# Patient Record
Sex: Female | Born: 1970 | Race: Black or African American | Hispanic: No | Marital: Single | Smoking: Never smoker
Health system: Southern US, Community
[De-identification: ages and names within clinical notes are randomized; demographics above are authoritative.]

## PROBLEM LIST (undated history)

## (undated) DIAGNOSIS — D649 Anemia, unspecified: Secondary | ICD-10-CM

## (undated) DIAGNOSIS — G629 Polyneuropathy, unspecified: Secondary | ICD-10-CM

## (undated) DIAGNOSIS — I10 Essential (primary) hypertension: Secondary | ICD-10-CM

## (undated) DIAGNOSIS — Z5189 Encounter for other specified aftercare: Secondary | ICD-10-CM

## (undated) DIAGNOSIS — G4730 Sleep apnea, unspecified: Secondary | ICD-10-CM

## (undated) DIAGNOSIS — T7840XA Allergy, unspecified, initial encounter: Secondary | ICD-10-CM

## (undated) HISTORY — DX: Polyneuropathy, unspecified: G62.9

## (undated) HISTORY — DX: Sleep apnea, unspecified: G47.30

## (undated) HISTORY — DX: Allergy, unspecified, initial encounter: T78.40XA

## (undated) HISTORY — DX: Anemia, unspecified: D64.9

## (undated) HISTORY — DX: Essential (primary) hypertension: I10

## (undated) HISTORY — DX: Encounter for other specified aftercare: Z51.89

## (undated) HISTORY — PX: ABDOMINAL HYSTERECTOMY: SHX81

---

## 2005-06-01 ENCOUNTER — Emergency Department: Payer: Self-pay | Admitting: Emergency Medicine

## 2005-06-02 ENCOUNTER — Inpatient Hospital Stay: Payer: Self-pay | Admitting: Internal Medicine

## 2005-06-12 ENCOUNTER — Ambulatory Visit: Payer: Self-pay | Admitting: Internal Medicine

## 2006-07-09 ENCOUNTER — Emergency Department: Payer: Self-pay | Admitting: Unknown Physician Specialty

## 2006-07-23 ENCOUNTER — Encounter: Payer: Self-pay | Admitting: Vascular Surgery

## 2006-08-14 ENCOUNTER — Encounter: Payer: Self-pay | Admitting: Vascular Surgery

## 2007-07-29 ENCOUNTER — Encounter: Payer: Self-pay | Admitting: Family Medicine

## 2008-08-30 ENCOUNTER — Ambulatory Visit: Payer: Self-pay

## 2009-04-22 ENCOUNTER — Emergency Department: Payer: Self-pay | Admitting: Emergency Medicine

## 2009-07-08 ENCOUNTER — Encounter: Payer: Self-pay | Admitting: Family Medicine

## 2009-07-15 ENCOUNTER — Encounter: Payer: Self-pay | Admitting: Family Medicine

## 2009-08-14 ENCOUNTER — Encounter: Payer: Self-pay | Admitting: Family Medicine

## 2010-02-11 ENCOUNTER — Observation Stay: Payer: Self-pay | Admitting: Internal Medicine

## 2010-07-03 ENCOUNTER — Ambulatory Visit: Payer: Self-pay | Admitting: Family Medicine

## 2010-10-22 ENCOUNTER — Ambulatory Visit: Payer: Self-pay | Admitting: Family Medicine

## 2011-04-26 ENCOUNTER — Emergency Department: Payer: Self-pay | Admitting: Internal Medicine

## 2011-07-27 IMAGING — CT CT CHEST W/ CM
1 series · 16 of 33 positions shown, 20 images · IV contrast (APPLIED)
Comparison: none

REASON FOR EXAM: cp sob high d dimer
COMMENTS:   May transport without cardiac monitor

PROCEDURE:     CT  - CT CHEST WITH CONTRAST  - February 11, 2010  [DATE]
RESULT:     Chest CT dated 02/11/2010.
TECHNIQUE: Helical 3 mm sections were obtained from the thoracic inlet
through lung bases status post intravenous administration of 100 mL of
Qsovue-AN5.

[Series 8: soft tissue · axial · 0.82mm/px · z∈[-698,-418]mm · 16 of 101 slices shown, 20 images]
[im 4/101  mediastinal]
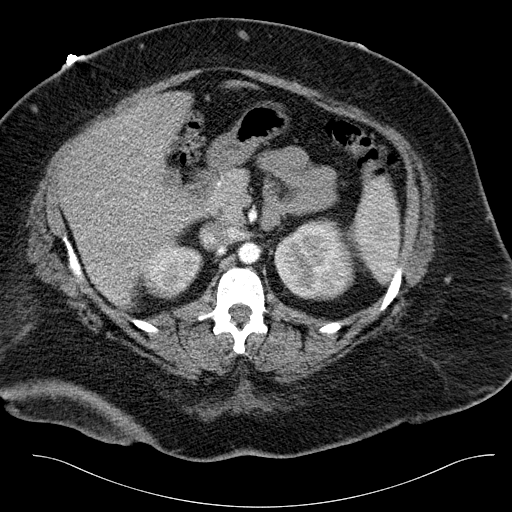
[im 4/101  lung]
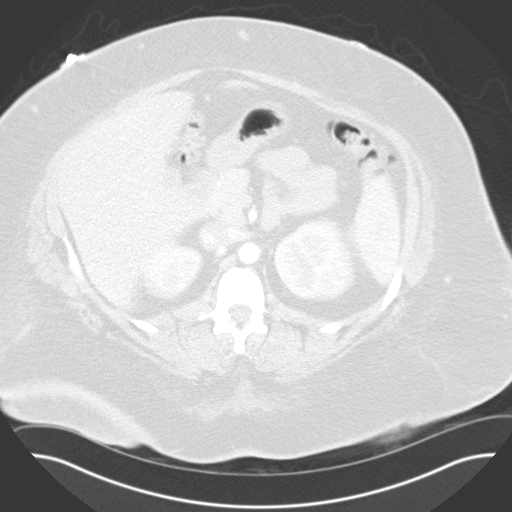
[im 12/101  lung]
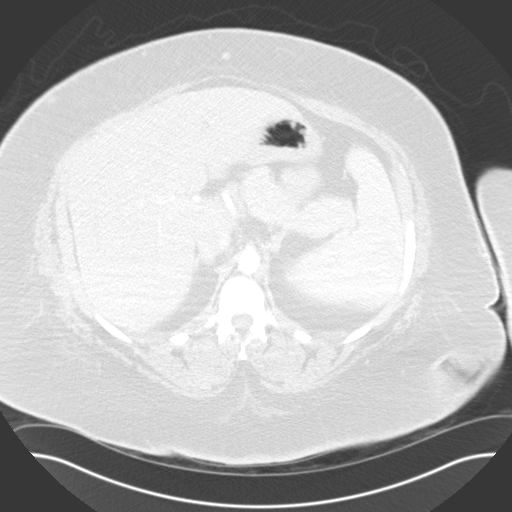
[im 19/101  lung]
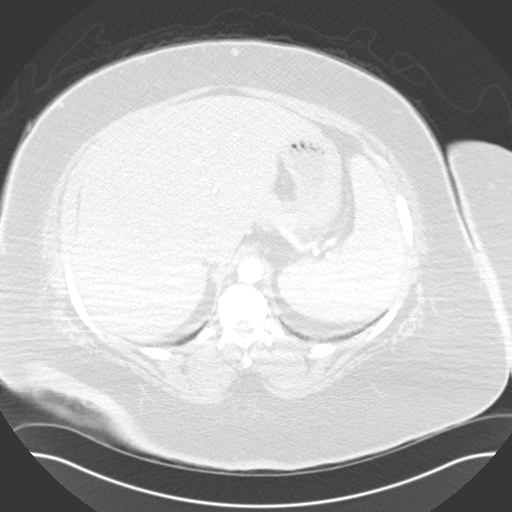
[im 23/101  lung]
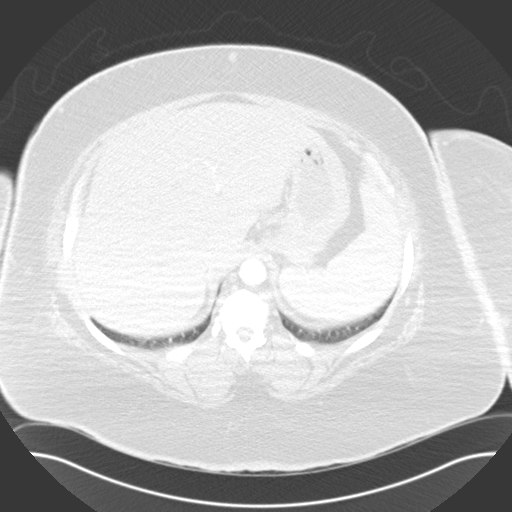
[im 30/101  mediastinal]
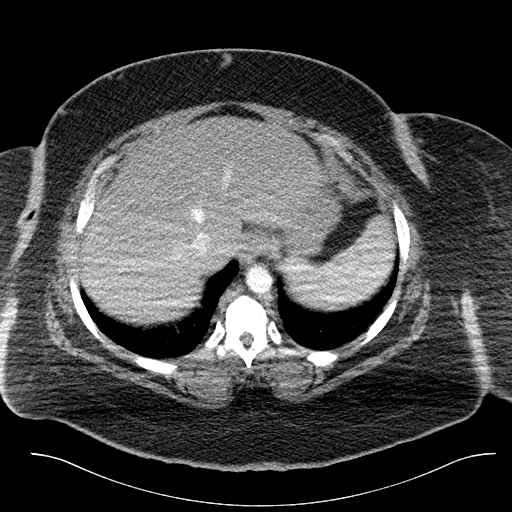
[im 30/101  lung]
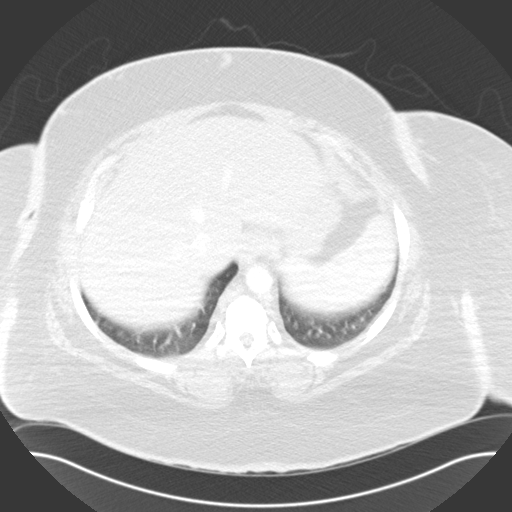
[im 38/101  lung]
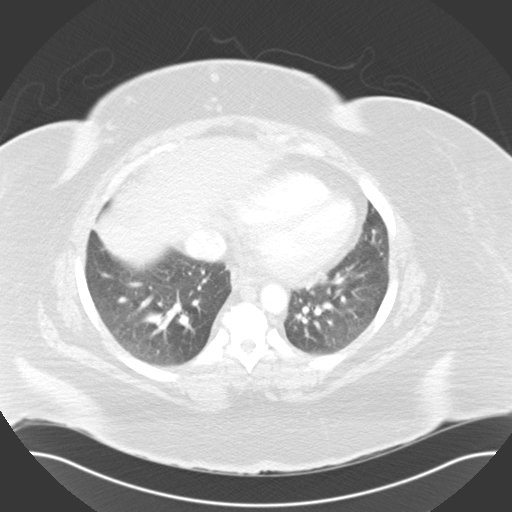
[im 45/101  lung]
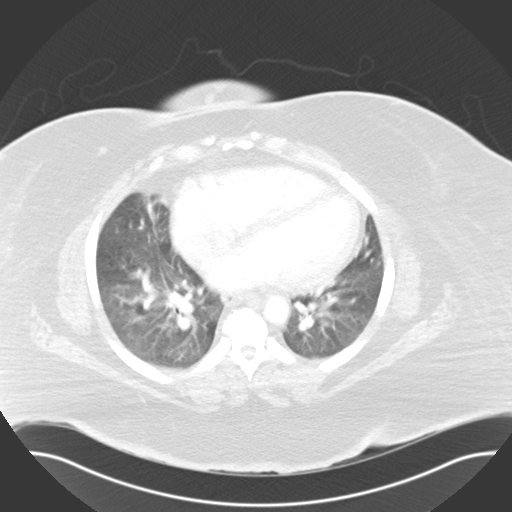
[im 49/101  lung]
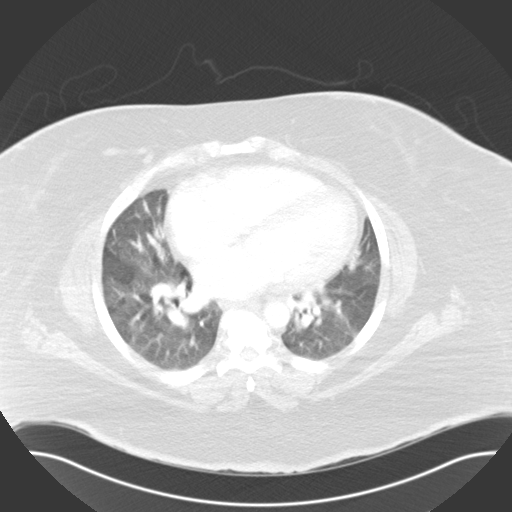
[im 54/101  mediastinal]
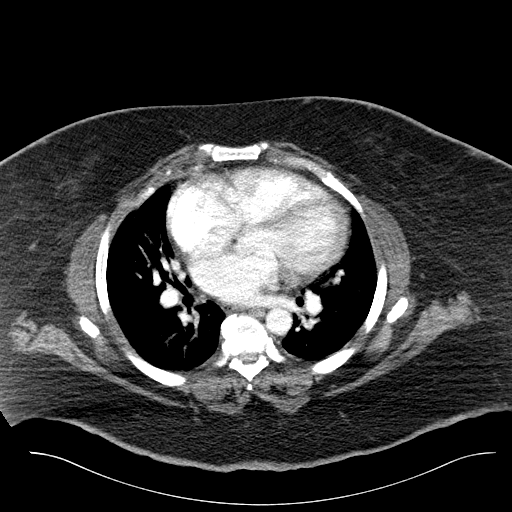
[im 54/101  lung]
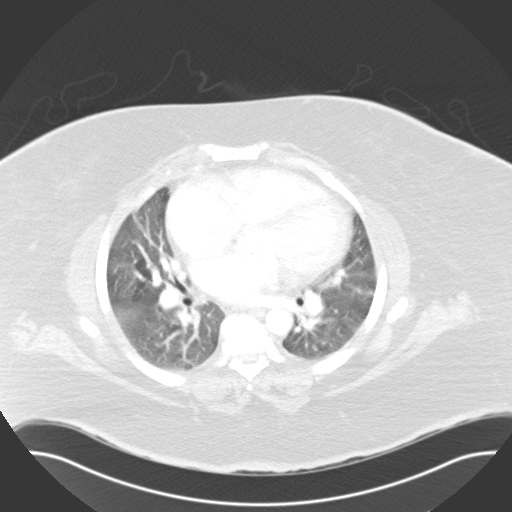
[im 60/101  lung]
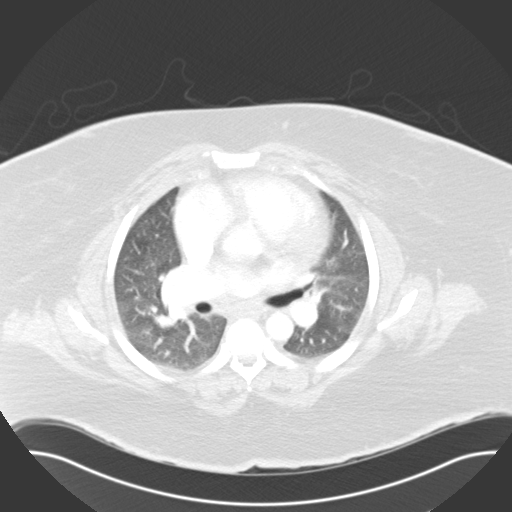
[im 63/101  lung]
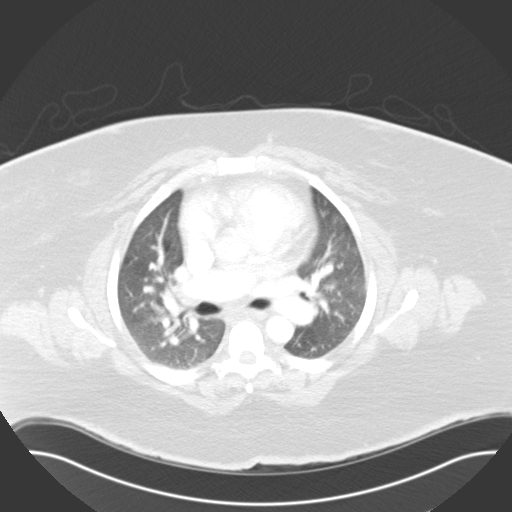
[im 71/101  lung]
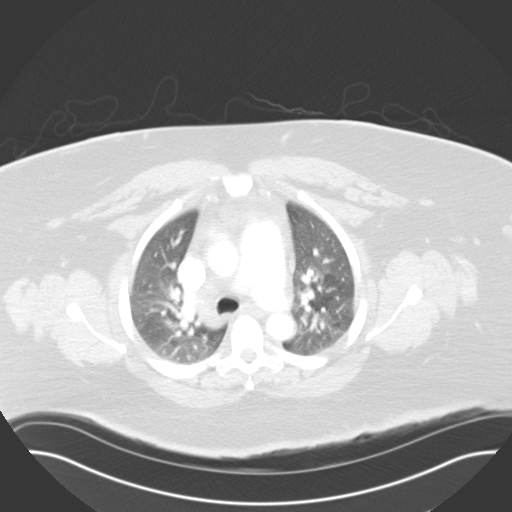
[im 78/101  mediastinal]
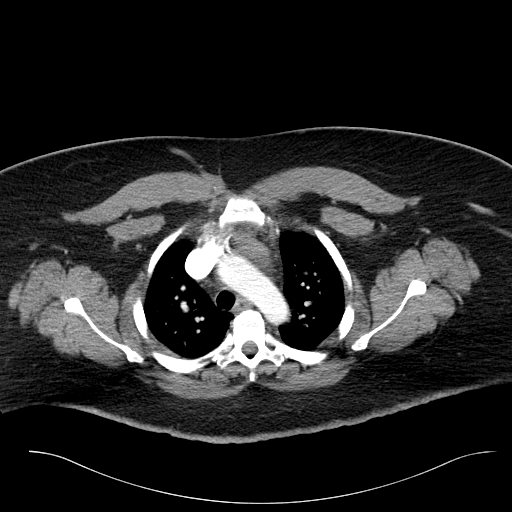
[im 78/101  lung]
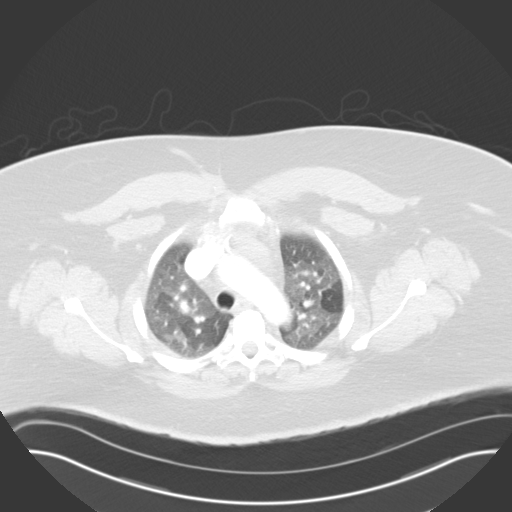
[im 82/101  lung]
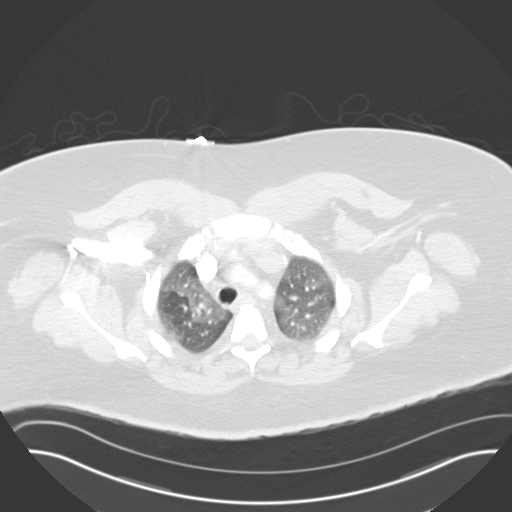
[im 89/101  lung]
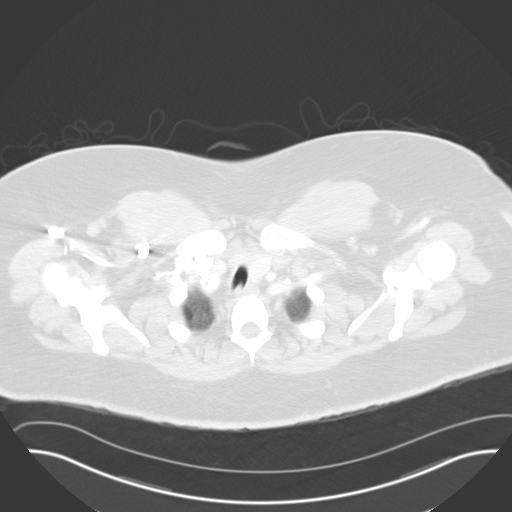
[im 97/101  lung]
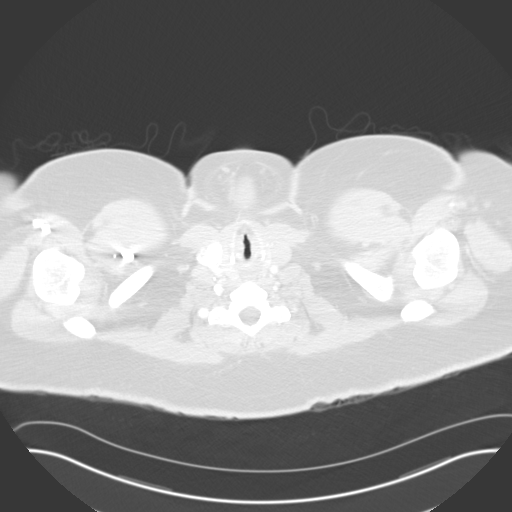

[16 of 33 positions shown; findings below may reference images not displayed]

FINDINGS: Evaluation of the mediastinum and hilar regions and structures
demonstrates no evidence of mediastinal nor hilar adenopathy nor masses.
There is no evidence of filling defects within the main, lobar, or segmental
pulmonary arteries. Evaluation of the lung parenchyma demonstrates diffuse
hypoventilation.

Visualized upper abdominal viscera demonstrate no gross abnormalities.
IMPRESSION: No CT evidence of pulmonary arterial embolic disease.
2. Hypoventilation within the lungs.

## 2014-04-10 DIAGNOSIS — K219 Gastro-esophageal reflux disease without esophagitis: Secondary | ICD-10-CM | POA: Insufficient documentation

## 2014-04-10 DIAGNOSIS — Z86718 Personal history of other venous thrombosis and embolism: Secondary | ICD-10-CM | POA: Insufficient documentation

## 2016-02-21 ENCOUNTER — Other Ambulatory Visit: Payer: Self-pay | Admitting: Family Medicine

## 2016-02-21 DIAGNOSIS — N644 Mastodynia: Secondary | ICD-10-CM

## 2016-03-16 ENCOUNTER — Other Ambulatory Visit: Payer: Self-pay | Admitting: Family Medicine

## 2016-03-16 DIAGNOSIS — N644 Mastodynia: Secondary | ICD-10-CM

## 2016-04-06 ENCOUNTER — Ambulatory Visit: Payer: Self-pay

## 2016-05-19 ENCOUNTER — Ambulatory Visit
Admission: RE | Admit: 2016-05-19 | Discharge: 2016-05-19 | Disposition: A | Discharge status: Home / Self Care | Payer: Medicaid Other | Source: Ambulatory Visit | Attending: Family Medicine | Admitting: Family Medicine

## 2016-05-19 DIAGNOSIS — Z1231 Encounter for screening mammogram for malignant neoplasm of breast: Secondary | ICD-10-CM | POA: Insufficient documentation

## 2016-05-19 DIAGNOSIS — N644 Mastodynia: Secondary | ICD-10-CM

## 2016-08-13 ENCOUNTER — Ambulatory Visit: Payer: Medicaid Other | Attending: Neurology

## 2016-08-25 ENCOUNTER — Ambulatory Visit: Payer: Medicaid Other | Attending: Neurology

## 2016-08-25 DIAGNOSIS — G4733 Obstructive sleep apnea (adult) (pediatric): Secondary | ICD-10-CM | POA: Insufficient documentation

## 2016-08-25 DIAGNOSIS — Z79899 Other long term (current) drug therapy: Secondary | ICD-10-CM | POA: Insufficient documentation

## 2016-08-25 DIAGNOSIS — I10 Essential (primary) hypertension: Secondary | ICD-10-CM | POA: Insufficient documentation

## 2016-08-25 DIAGNOSIS — I890 Lymphedema, not elsewhere classified: Secondary | ICD-10-CM | POA: Diagnosis not present

## 2016-08-25 DIAGNOSIS — Z7982 Long term (current) use of aspirin: Secondary | ICD-10-CM | POA: Diagnosis not present

## 2016-08-25 DIAGNOSIS — Z9981 Dependence on supplemental oxygen: Secondary | ICD-10-CM | POA: Insufficient documentation

## 2016-08-25 DIAGNOSIS — E119 Type 2 diabetes mellitus without complications: Secondary | ICD-10-CM | POA: Diagnosis not present

## 2016-08-25 DIAGNOSIS — Z6841 Body Mass Index (BMI) 40.0 and over, adult: Secondary | ICD-10-CM | POA: Diagnosis not present

## 2016-08-25 DIAGNOSIS — G4730 Sleep apnea, unspecified: Secondary | ICD-10-CM | POA: Diagnosis present

## 2016-11-26 ENCOUNTER — Ambulatory Visit: Payer: Medicaid Other | Attending: Neurology

## 2016-11-26 DIAGNOSIS — G4733 Obstructive sleep apnea (adult) (pediatric): Secondary | ICD-10-CM | POA: Diagnosis not present

## 2023-03-17 ENCOUNTER — Ambulatory Visit: Payer: Medicaid Other | Admitting: Family Medicine

## 2023-03-17 ENCOUNTER — Encounter: Payer: Self-pay | Admitting: Family Medicine

## 2023-03-17 VITALS — BP 141/79 | HR 70 | Ht 59.0 in

## 2023-03-17 DIAGNOSIS — M792 Neuralgia and neuritis, unspecified: Secondary | ICD-10-CM | POA: Diagnosis not present

## 2023-03-17 DIAGNOSIS — G4730 Sleep apnea, unspecified: Secondary | ICD-10-CM

## 2023-03-17 DIAGNOSIS — F339 Major depressive disorder, recurrent, unspecified: Secondary | ICD-10-CM

## 2023-03-17 DIAGNOSIS — Z23 Encounter for immunization: Secondary | ICD-10-CM

## 2023-03-17 DIAGNOSIS — R262 Difficulty in walking, not elsewhere classified: Secondary | ICD-10-CM | POA: Insufficient documentation

## 2023-03-17 DIAGNOSIS — Z1211 Encounter for screening for malignant neoplasm of colon: Secondary | ICD-10-CM

## 2023-03-17 DIAGNOSIS — Z1231 Encounter for screening mammogram for malignant neoplasm of breast: Secondary | ICD-10-CM

## 2023-03-17 DIAGNOSIS — Z6841 Body Mass Index (BMI) 40.0 and over, adult: Secondary | ICD-10-CM | POA: Diagnosis not present

## 2023-03-17 DIAGNOSIS — I890 Lymphedema, not elsewhere classified: Secondary | ICD-10-CM | POA: Diagnosis not present

## 2023-03-17 DIAGNOSIS — R5382 Chronic fatigue, unspecified: Secondary | ICD-10-CM

## 2023-03-17 DIAGNOSIS — I10 Essential (primary) hypertension: Secondary | ICD-10-CM

## 2023-03-17 DIAGNOSIS — E559 Vitamin D deficiency, unspecified: Secondary | ICD-10-CM

## 2023-03-17 DIAGNOSIS — R531 Weakness: Secondary | ICD-10-CM

## 2023-03-17 DIAGNOSIS — L97929 Non-pressure chronic ulcer of unspecified part of left lower leg with unspecified severity: Secondary | ICD-10-CM

## 2023-03-17 DIAGNOSIS — R6889 Other general symptoms and signs: Secondary | ICD-10-CM

## 2023-03-17 DIAGNOSIS — R32 Unspecified urinary incontinence: Secondary | ICD-10-CM

## 2023-03-17 DIAGNOSIS — Z0000 Encounter for general adult medical examination without abnormal findings: Secondary | ICD-10-CM | POA: Diagnosis not present

## 2023-03-17 NOTE — Progress Notes (Addendum)
ergo   New patient visit   Patient: Gabriela Mcgee   DOB: January 02, 1971   52 y.o. Female  MRN: [id number] Visit Date: 03/17/2023  Today's healthcare provider: [name], DO   Chief Complaint  Patient presents with   New Patient (Initial Visit)    Patient reports a sore on the back of her leg and possibly on the right leg. Patient reports she has had the sore for about 2 months now, associated with discharge. Reports no change in size and it will try healing itself and opens back up. Patient reports she is also having concerns wit sleeping laying down and states she currently sleeps in a recliner. Reports she has been doing so for years now. Also can not tell when she has to pee and when she does she is not able to hold it she has to go.    Subjective    Gabriela Mcgee is a 52 y.o. female who presents today as a new patient to establish care.  HPI HPI     New Patient (Initial Visit)    Additional comments: Patient reports a sore on the back of her leg and possibly on the right leg. Patient reports she has had the sore for about 2 months now, associated with discharge. Reports no change in size and it will try healing itself and opens back up. Patient reports she is also having concerns wit sleeping laying down and states she currently sleeps in a recliner. Reports she has been doing so for years now. Also can not tell when she has to pee and when she does she is not able to hold it she has to go.         Comments   -Patients sister is having hard time keeping toe nails cut due to skin starting to grow right on them.  -NO [facility] record       Last edited by Acey Lav, CMA on 03/17/2023  3:36 PM.      Sore on back of left leg:  - has had for about two months. Gets a little better, then gets worse again. Worries about covering it because she is worried about getting an infection.  - her sister works at the hospital and has been checking it for her  - some days doesn't drain  at all; other days drains "like running water"  - hasn't been out of the house much at all in the last three months  - Thinks right leg has a sore as well but can't see it  - Fluid leaks through the skin.  Can't stand up or sit down for long.  Bilateral lymph edema:   - Previously had someone coming to the house to fit her for wraps. Has the wraps but no one would come due to pandemic starting.  - Has had several surgeries to get rid of the lumps.  - Has severe neuropathy; extreme numbness and burning  - Has trouble finding shoes. Has trouble with grip on floor (slides). Can't always feel the floor under her feet. Never walks without shoes  - Used to being able to get up, go into kitchen and fix lunch and dinner; is not able to do so anymore.  Has sleep apnea. Has had machine a very long time but she feels it still works very well. Sleeps in a recliner but it doesn't work well.  Pees regularly (no frequency) but has incontinence  Used to take medicine for anxiety.  Having a lot more anxiety now.  Very irritable now.   Was previously taking: Oxybutynin 5mg  Citalopram 20mg  Losartan 25mg  Naproxen 500mg  - > not prescribing Gabapentin 600mg  bid -> lower dose tid Albuterol inhaler Aspirin 81mg  - hold off  - Before her first surgery, they "thought she might have blood clots and recommended aspirin." She hasn't been taking for several years now.    Past Medical History:  Diagnosis Date   Allergy    Anemia    Blood transfusion without reported diagnosis    Hypertension    Neuropathy    Sleep apnea    History reviewed. No pertinent surgical history. Family Status  Relation Name Status   Mother  Deceased   Neg Hx  (Not Specified)  No partnership data on file   Family History  Problem Relation Age of Onset   Heart failure Mother 15 - 53   Breast cancer Neg Hx    Social History   Socioeconomic History   Marital status: Single    Spouse name: Not on file   Number of  children: Not on file   Years of education: Not on file   Highest education level: Not on file  Occupational History   Not on file  Tobacco Use   Smoking status: Never   Smokeless tobacco: Never  Vaping Use   Vaping status: Never Used  Substance and Sexual Activity   Alcohol use: Never   Drug use: Never   Sexual activity: Not on file  Other Topics Concern   Not on file  Social History Narrative   Not on file   Social Determinants of Health   Financial Resource Strain: Not on file  Food Insecurity: No Food Insecurity (03/25/2023)   Hunger Vital Sign    Worried About Running Out of Food in the Last Year: Never true    Ran Out of Food in the Last Year: Never true  Transportation Needs: No Transportation Needs (03/25/2023)   PRAPARE - Administrator, Civil Service (Medical): No    Lack of Transportation (Non-Medical): No  Physical Activity: Not on file  Stress: Not on file  Social Connections: Not on file   No outpatient medications prior to visit.   No facility-administered medications prior to visit.   Allergies  Allergen Reactions   Lisinopril Swelling   Silicone Dermatitis    Paper and silk tape OK per patient 04/10/2014   Morphine     Very hard to wake up afterwards   Fluconazole Rash   Penicillin G Rash   Tape Dermatitis and Rash    Oozing    Immunization History  Administered Date(s) Administered   Influenza,inj,Quad PF,6+ Mos 09/10/2017    Health Maintenance  Topic Date Due   DTaP/Tdap/Td (1 - Tdap) Never done   PAP SMEAR-Modifier  Never done   Colonoscopy  Never done   MAMMOGRAM  08/29/2019   Zoster Vaccines- Shingrix (1 of 2) Never done   COVID-19 Vaccine (1 - 2023-24 season) Never done   INFLUENZA VACCINE  04/15/2023   Hepatitis C Screening  Completed   HIV Screening  Completed   HPV VACCINES  Aged Out    Patient Care Team: Ron Junco, Monico Blitz, DO as PCP - General (Family Medicine) [name], RN as Case Manager  Review of  Systems  Constitutional:  Positive for fatigue. Negative for chills and fever.  HENT:  Negative for congestion, rhinorrhea and sneezing.   Eyes:  Negative for itching and  visual disturbance.  Respiratory:  Positive for apnea and shortness of breath. Negative for cough, chest tightness, wheezing and stridor.   Cardiovascular:  Negative for chest pain and palpitations.  Gastrointestinal:  Negative for abdominal pain, nausea and vomiting.  Genitourinary:  Positive for enuresis. Negative for frequency.  Musculoskeletal:  Positive for gait problem.  Skin:  Positive for color change and wound. Negative for rash.  Neurological:  Positive for weakness and numbness. Negative for dizziness and light-headedness.  Psychiatric/Behavioral:  The patient is nervous/anxious.        Objective    BP (!) 141/79   Pulse 70   Ht 4\' 11"  (1.499 m)   SpO2 100%    Physical Exam Vitals and nursing note reviewed.  Constitutional:      General: She is not in acute distress.    Appearance: Normal appearance. She is well-developed. She is morbidly obese. She is not diaphoretic.  HENT:     Head: Normocephalic and atraumatic.     Right Ear: Tympanic membrane, ear canal and external ear normal.     Left Ear: Tympanic membrane, ear canal and external ear normal.     Nose: Nose normal.     Mouth/Throat:     Mouth: Mucous membranes are moist.     Pharynx: Oropharynx is clear. No oropharyngeal exudate.  Eyes:     General: No scleral icterus.    Extraocular Movements: Extraocular movements intact.     Conjunctiva/sclera: Conjunctivae normal.     Pupils: Pupils are equal, round, and reactive to light.  Neck:     Thyroid: No thyromegaly.  Cardiovascular:     Rate and Rhythm: Normal rate and regular rhythm.     Pulses: Normal pulses.     Heart sounds: Normal heart sounds. No murmur heard. Pulmonary:     Effort: Pulmonary effort is normal. No respiratory distress.     Breath sounds: Normal breath sounds. No  wheezing or rales.  Abdominal:     General: Bowel sounds are normal. There is no distension.     Palpations: Abdomen is soft. There is no mass.     Tenderness: There is no abdominal tenderness. There is no guarding.  Musculoskeletal:        General: Deformity present.     Cervical back: Neck supple.     Right hip: Decreased range of motion.     Left hip: Decreased range of motion.     Right upper leg: Swelling, edema and deformity present.     Left upper leg: Swelling, edema and deformity present.     Right knee: Swelling and deformity present. Decreased range of motion.     Left knee: Swelling and deformity present. Decreased range of motion.     Right lower leg: Swelling and deformity present. Edema present.     Left lower leg: Swelling and deformity present. Edema present.     Right ankle: Swelling and deformity present. Decreased range of motion.     Left ankle: Swelling and deformity present. Decreased range of motion.     Right foot: Decreased range of motion. Swelling and deformity present.     Left foot: Decreased range of motion. Swelling and deformity present.     Comments: Deformity s/t extreme chronic swelling and previous excisions.   Lymphadenopathy:     Cervical: No cervical adenopathy.  Skin:    General: Skin is warm and dry.     Findings: Wound present. No rash.     Comments: Wound  present to posterior aspect of left leg as documented in pictures under media tab. Significant serous drainage present. No evidence of wound infection.  Neurological:     Mental Status: She is alert and oriented to person, place, and time. Mental status is at baseline.     Gait: Gait abnormal.  Psychiatric:        Mood and Affect: Mood normal.        Behavior: Behavior normal.        Thought Content: Thought content normal.     Depression Screen    03/17/2023    3:36 PM  PHQ 2/9 Scores  PHQ - 2 Score 3  PHQ- 9 Score 12   Results for orders placed or performed in visit on 03/17/23   Comprehensive metabolic panel  Result Value Ref Range   Glucose 91 70 - 99 mg/dL   BUN 11 6 - 24 mg/dL   Creatinine, Ser 1.09 0.57 - 1.00 mg/dL   eGFR 323 >55 DD/UKG/2.54   BUN/Creatinine Ratio 19 9 - 23   Sodium 141 134 - 144 mmol/L   Potassium 4.3 3.5 - 5.2 mmol/L   Chloride 103 96 - 106 mmol/L   CO2 25 20 - 29 mmol/L   Calcium 9.4 8.7 - 10.2 mg/dL   Total Protein 7.8 6.0 - 8.5 g/dL   Albumin 4.3 3.8 - 4.9 g/dL   Globulin, Total 3.5 1.5 - 4.5 g/dL   Bilirubin Total 1.1 0.0 - 1.2 mg/dL   Alkaline Phosphatase 77 44 - 121 IU/L   AST 8 0 - 40 IU/L   ALT 6 0 - 32 IU/L  Hemoglobin A1c  Result Value Ref Range   Hgb A1c MFr Bld 6.3 (H) 4.8 - 5.6 %   Est. average glucose Bld gHb Est-mCnc 134 mg/dL  Lipid panel  Result Value Ref Range   Cholesterol, Total 152 100 - 199 mg/dL   Triglycerides 73 0 - 149 mg/dL   HDL 54 >27 mg/dL   VLDL Cholesterol Cal 14 5 - 40 mg/dL   LDL Chol Calc (NIH) 84 0 - 99 mg/dL   Chol/HDL Ratio 2.8 0.0 - 4.4 ratio  VITAMIN D 25 Hydroxy (Vit-D Deficiency, Fractures)  Result Value Ref Range   Vit D, 25-Hydroxy 6.5 (L) 30.0 - 100.0 ng/mL  Vitamin B12  Result Value Ref Range   Vitamin B-12 520 232 - 1,245 pg/mL  TSH Rfx on Abnormal to Free T4  Result Value Ref Range   TSH 3.110 0.450 - 4.500 uIU/mL  CBC With Differential  Result Value Ref Range   WBC 4.1 3.4 - 10.8 x10E3/uL   RBC 5.28 3.77 - 5.28 x10E6/uL   Hemoglobin 12.5 11.1 - 15.9 g/dL   Hematocrit 06.2 37.6 - 46.6 %   MCV 76 (L) 79 - 97 fL   MCH 23.7 (L) 26.6 - 33.0 pg   MCHC 31.2 (L) 31.5 - 35.7 g/dL   RDW 28.3 (H) 15.1 - 76.1 %   Neutrophils 64 Not Estab. %   Lymphs 26 Not Estab. %   Monocytes 7 Not Estab. %   Eos 2 Not Estab. %   Basos 1 Not Estab. %   Neutrophils Absolute 2.6 1.4 - 7.0 x10E3/uL   Lymphocytes Absolute 1.1 0.7 - 3.1 x10E3/uL   Monocytes Absolute 0.3 0.1 - 0.9 x10E3/uL   EOS (ABSOLUTE) 0.1 0.0 - 0.4 x10E3/uL   Basophils Absolute 0.0 0.0 - 0.2 x10E3/uL   Immature  Granulocytes 0 Not Estab. %  Immature Grans (Abs) 0.0 0.0 - 0.1 x10E3/uL  HCV Ab w Reflex to Quant PCR  Result Value Ref Range   HCV Ab Non Reactive Non Reactive  HIV Antibody (routine testing w rflx)  Result Value Ref Range   HIV Screen 4th Generation wRfx Non Reactive Non Reactive  Interpretation:  Result Value Ref Range   HCV Interp 1: Comment     Assessment & Plan     Annual physical exam Assessment & Plan: Physical exam with abnormalities as noted.  Patient has significant difficulty leaving her home due to her extreme lymphedema, which causes her significant neuropathic pain and makes ambulation extremely difficult and tiring, with associated dyspnea on exertion.  Will refer patient for home health services.  -Will also order routine lab work as noted below to have a baseline.  Will screen for hepatitis C and HIV as well as refer patient for mammogram.  Patient does not want colonoscopy but is amenable to screening with a Cologuard test.  Will order this as well.  Orders: -     3D Screening Mammogram, Left and Right; Future -     Comprehensive metabolic panel -     Lipid panel -     TSH Rfx on Abnormal to Free T4 -     CBC With Differential -     HCV Ab w Reflex to Quant PCR -     HIV Antibody (routine testing w rflx) -     Cologuard -     Interpretation:  Vitamin D deficiency -     VITAMIN D 25 Hydroxy (Vit-D Deficiency, Fractures) -     Interpretation: -     Vitamin D (Ergocalciferol); Take 1 capsule (50,000 Units total) by mouth every 7 (seven) days.  Dispense: 12 capsule; Refill: 1  Morbid obesity with BMI of 60.0-69.9, adult (HCC) -     Hemoglobin A1c -     Ambulatory referral to Home Health -     Interpretation: -     AMB Referral to Managed Medicaid Care Management  Increased weakness when ambulating Assessment & Plan: Patient experiences significant weakness with ambulation and is unable to ambulate very far prior to needing to rest.  Orders: -      Ambulatory referral to Home Health -     For home use only DME Hospital bed -     AMB Referral to Managed Medicaid Care Management  Difficulty maintaining body in lying position Assessment & Plan: Patient has significant difficulty/inability to lay down beyond a slightly reclined sitting position.  She states that if she does lay down, she is unable to position herself or turn from side-to-side.  She also experiences severe difficulty breathing, likely secondary to her body habitus.  Will order a hospital bed for home use.  Orders: -     For home use only DME Hospital bed -     AMB Referral to Managed Medicaid Care Management  Sleep apnea treated with nocturnal bilevel positive airway pressure (BPAP) Assessment & Plan: Patient currently utilizing BiPAP every night, throughout the night.  Orders: -     For home use only DME Hospital bed -     AMB Referral to Managed Medicaid Care Management  Lymphedema of both lower extremities Assessment & Plan: Patient has severe lymphedema of her bilateral lower extremities with history of multiple prior surgical excisions.  Will refer patient for home health due to her extreme difficulty leaving her home.  However, we will also  have to refer her to occupational therapy due to the need for treatment by a lymphedema specialist.  Will also refer to manage Medicaid care management to facilitate coordination of care and ensure the patient is having the follow-up she needs, especially given that she was lost to follow-up once the pandemic started, which caused her lymphedema to become substantially worse than it could have been has she been consistently treated.  Orders: -     Ambulatory referral to Home Health -     Ambulatory referral to Occupational Therapy -     AMB Referral to Managed Medicaid Care Management  Depression, recurrent [facility] Assessment & Plan: Restarting patient citalopram as noted below.  Patient recalls doing well on it.  Will have  patient follow-up via a virtual appointment.  Orders: -     Citalopram Hydrobromide; Take 1 tablet (20 mg total) by mouth daily.  Dispense: 30 tablet; Refill: 3 -     AMB Referral to Managed Medicaid Care Management  Primary hypertension Assessment & Plan: Patient's blood pressure is elevated today.  However, she is not at the blood pressure medication she was previously treated with.  Will refill today as noted below.  Referring patient for home health, so we will plan on having them check her vital signs at home.  Orders: -     Losartan Potassium; Take 1 tablet (25 mg total) by mouth daily.  Dispense: 30 tablet; Refill: 0  Peripheral neuropathic pain Assessment & Plan: Refilled patient's gabapentin, starting at a lower dose, as I discussed with her during her visit.  Will follow-up with her at a subsequent virtual appointment to see how effective this is for her.  Orders: -     Vitamin B12 -     Gabapentin; Take 1 capsule (300 mg total) by mouth 3 (three) times daily.  Dispense: 90 capsule; Refill: 0 -     AMB Referral to Managed Medicaid Care Management  Urinary incontinence, unspecified type Assessment & Plan: Patient suffered significant urinary incontinence, exacerbated by not having her prescription for oxybutynin, after being lost to follow-up due to COVID.  Refilling her oxybutynin today.  Orders: -     oxyBUTYnin Chloride ER; Take 1 tablet (5 mg total) by mouth at bedtime.  Dispense: 30 tablet; Refill: 0 -     AMB Referral to Managed Medicaid Care Management  Venous stasis ulcer of left lower extremity (HCC) Assessment & Plan: Patient has a significant venous stasis ulcer on the posterior left extremity (approx. 4 inch x 5.5 inch), as well as a smaller stasis ulcer inferior lateral to that (approx. 3/4 inch diameter).  These do not appear infected at this time.  Will plan to have home health address these in her home with regular wound care. Follow with OT for lymphedema  treatment will also facilitate healing of these ulcers.  Orders: -     AMB Referral to Managed Medicaid Care Management  Other orders -     Albuterol Sulfate HFA; Inhale 1-2 puffs into the lungs every 6 (six) hours as needed for shortness of breath.  Dispense: 1 each; Refill: 2     Return in about 3 weeks (around 04/07/2023) for virtual, Dep.     The entirety of the information documented in the History of Present Illness, Review of Systems and Physical Exam were personally obtained by me. Portions of this information were initially documented by the CMA,Sha'taria Chales Abrahams, and reviewed by me for thoroughness and accuracy.   I  discussed the assessment and treatment plan with the patient  The patient was provided an opportunity to ask questions and all were answered. The patient agreed with the plan and demonstrated an understanding of the instructions.   The patient was advised to call back or seek an in-person evaluation if the symptoms worsen or if the condition fails to improve as anticipated.    [name], DO  [facility] [phone] (phone) [phone] (fax)  [facility]

## 2023-03-18 LAB — LIPID PANEL
Chol/HDL Ratio: 2.8 ratio (ref 0.0–4.4)
Cholesterol, Total: 152 mg/dL (ref 100–199)
HDL: 54 mg/dL (ref >39)
LDL Chol Calc (NIH): 84 mg/dL (ref 0–99)
Triglycerides: 73 mg/dL (ref 0–149)
VLDL Cholesterol Cal: 14 mg/dL (ref 5–40)

## 2023-03-18 LAB — COMPREHENSIVE METABOLIC PANEL
ALT: 6 IU/L (ref 0–32)
AST: 8 IU/L (ref 0–40)
Albumin: 4.3 g/dL (ref 3.8–4.9)
Alkaline Phosphatase: 77 IU/L (ref 44–121)
BUN/Creatinine Ratio: 19 (ref 9–23)
BUN: 11 mg/dL (ref 6–24)
Bilirubin Total: 1.1 mg/dL (ref 0.0–1.2)
CO2: 25 mmol/L (ref 20–29)
Calcium: 9.4 mg/dL (ref 8.7–10.2)
Chloride: 103 mmol/L (ref 96–106)
Creatinine, Ser: 0.59 mg/dL (ref 0.57–1.00)
Globulin, Total: 3.5 g/dL (ref 1.5–4.5)
Glucose: 91 mg/dL (ref 70–99)
Potassium: 4.3 mmol/L (ref 3.5–5.2)
Sodium: 141 mmol/L (ref 134–144)
Total Protein: 7.8 g/dL (ref 6.0–8.5)
eGFR: 108 mL/min/{1.73_m2} (ref >59)

## 2023-03-18 LAB — CBC WITH DIFFERENTIAL
Basophils Absolute: 0 10*3/uL (ref 0.0–0.2)
Basos: 1 %
EOS (ABSOLUTE): 0.1 10*3/uL (ref 0.0–0.4)
Eos: 2 %
Hematocrit: 40.1 % (ref 34.0–46.6)
Hemoglobin: 12.5 g/dL (ref 11.1–15.9)
Immature Grans (Abs): 0 10*3/uL (ref 0.0–0.1)
Immature Granulocytes: 0 %
Lymphocytes Absolute: 1.1 10*3/uL (ref 0.7–3.1)
Lymphs: 26 %
MCH: 23.7 pg — ABNORMAL LOW (ref 26.6–33.0)
MCHC: 31.2 g/dL — ABNORMAL LOW (ref 31.5–35.7)
MCV: 76 fL — ABNORMAL LOW (ref 79–97)
Monocytes Absolute: 0.3 10*3/uL (ref 0.1–0.9)
Monocytes: 7 %
Neutrophils Absolute: 2.6 10*3/uL (ref 1.4–7.0)
Neutrophils: 64 %
RBC: 5.28 x10E6/uL (ref 3.77–5.28)
RDW: 16 % — ABNORMAL HIGH (ref 11.7–15.4)
WBC: 4.1 10*3/uL (ref 3.4–10.8)

## 2023-03-18 LAB — HEMOGLOBIN A1C
Est. average glucose Bld gHb Est-mCnc: 134 mg/dL
Hgb A1c MFr Bld: 6.3 % — ABNORMAL HIGH (ref 4.8–5.6)

## 2023-03-18 LAB — HCV AB W REFLEX TO QUANT PCR: HCV Ab: NONREACTIVE

## 2023-03-18 LAB — VITAMIN B12: Vitamin B-12: 520 pg/mL (ref 232–1245)

## 2023-03-18 LAB — VITAMIN D 25 HYDROXY (VIT D DEFICIENCY, FRACTURES): Vit D, 25-Hydroxy: 6.5 ng/mL — ABNORMAL LOW (ref 30.0–100.0)

## 2023-03-18 LAB — HIV ANTIBODY (ROUTINE TESTING W REFLEX): HIV Screen 4th Generation wRfx: NONREACTIVE

## 2023-03-18 LAB — TSH RFX ON ABNORMAL TO FREE T4: TSH: 3.11 u[IU]/mL (ref 0.450–4.500)

## 2023-03-18 LAB — HCV INTERPRETATION

## 2023-03-18 MED ORDER — VITAMIN D (ERGOCALCIFEROL) 1.25 MG (50000 UNIT) PO CAPS
50000.0000 [IU] | ORAL_CAPSULE | ORAL | 1 refills | Status: DC
Start: 2023-03-18 — End: 2023-12-31

## 2023-03-18 MED ORDER — ALBUTEROL SULFATE HFA 108 (90 BASE) MCG/ACT IN AERS: 1.0000 | INHALATION_SPRAY | Freq: Four times a day (QID) | RESPIRATORY_TRACT | 2 refills | Status: DC | PRN

## 2023-03-18 MED ORDER — CITALOPRAM HYDROBROMIDE 20 MG PO TABS
20.0000 mg | ORAL_TABLET | Freq: Every day | ORAL | 3 refills | Status: DC
Start: 2023-03-18 — End: 2023-07-30

## 2023-03-18 MED ORDER — LOSARTAN POTASSIUM 25 MG PO TABS
25.0000 mg | ORAL_TABLET | Freq: Every day | ORAL | 0 refills | Status: DC
Start: 2023-03-18 — End: 2023-05-13

## 2023-03-18 MED ORDER — OXYBUTYNIN CHLORIDE ER 5 MG PO TB24
5.0000 mg | ORAL_TABLET | Freq: Every day | ORAL | 0 refills | Status: DC
Start: 2023-03-18 — End: 2023-03-31

## 2023-03-18 MED ORDER — GABAPENTIN 300 MG PO CAPS
300.0000 mg | ORAL_CAPSULE | Freq: Three times a day (TID) | ORAL | 0 refills | Status: DC
Start: 2023-03-18 — End: 2023-06-21

## 2023-03-25 ENCOUNTER — Encounter: Payer: Self-pay | Admitting: Family Medicine

## 2023-03-25 ENCOUNTER — Encounter: Payer: Self-pay | Admitting: *Deleted

## 2023-03-25 ENCOUNTER — Other Ambulatory Visit: Payer: Medicaid Other | Admitting: *Deleted

## 2023-03-25 ENCOUNTER — Telehealth: Payer: Self-pay | Admitting: Family Medicine

## 2023-03-25 DIAGNOSIS — L97929 Non-pressure chronic ulcer of unspecified part of left lower leg with unspecified severity: Secondary | ICD-10-CM | POA: Insufficient documentation

## 2023-03-25 DIAGNOSIS — I10 Essential (primary) hypertension: Secondary | ICD-10-CM | POA: Insufficient documentation

## 2023-03-25 DIAGNOSIS — F339 Major depressive disorder, recurrent, unspecified: Secondary | ICD-10-CM | POA: Insufficient documentation

## 2023-03-25 DIAGNOSIS — I890 Lymphedema, not elsewhere classified: Secondary | ICD-10-CM | POA: Insufficient documentation

## 2023-03-25 DIAGNOSIS — R5382 Chronic fatigue, unspecified: Secondary | ICD-10-CM | POA: Insufficient documentation

## 2023-03-25 DIAGNOSIS — R6889 Other general symptoms and signs: Secondary | ICD-10-CM | POA: Insufficient documentation

## 2023-03-25 DIAGNOSIS — M792 Neuralgia and neuritis, unspecified: Secondary | ICD-10-CM | POA: Insufficient documentation

## 2023-03-25 DIAGNOSIS — Z0000 Encounter for general adult medical examination without abnormal findings: Secondary | ICD-10-CM | POA: Insufficient documentation

## 2023-03-25 DIAGNOSIS — R32 Unspecified urinary incontinence: Secondary | ICD-10-CM | POA: Insufficient documentation

## 2023-03-25 NOTE — Assessment & Plan Note (Signed)
Patient's blood pressure is elevated today.  However, she is not at the blood pressure medication she was previously treated with.  Will refill today as noted below.  Referring patient for home health, so we will plan on having them check her vital signs at home.

## 2023-03-25 NOTE — Telephone Encounter (Signed)
Maggie with [facility] called recd a referral for home care for this patient but pt wants to wait until July 16th to begin.  [phone]

## 2023-03-25 NOTE — Patient Outreach (Signed)
Medicaid Managed Care   Nurse Care Manager Note  03/25/2023 Name:  Gabriela Mcgee MRN:  [id number] DOB:  [birth year]  Gabriela Mcgee is an 52 y.o. year old female who is a primary patient of Gabriela Hay, DO.  The [facility] Managed Care Coordination team was consulted for assistance with:    Lymphedema  [name] was given information about Medicaid Managed Care Coordination team services today. Gabriela Mcgee Patient agreed to services and verbal consent obtained.  Engaged with patient by telephone for initial visit in response to provider referral for case management and/or care coordination services.   Assessments/Interventions:  Review of past medical history, allergies, medications, health status, including review of consultants reports, laboratory and other test data, was performed as part of comprehensive evaluation and provision of chronic care management services.  SDOH (Social Determinants of Health) assessments and interventions performed: SDOH Interventions    Flowsheet Row Patient Outreach Telephone from 03/25/2023 in [place] POPULATION HEALTH DEPARTMENT Office Visit from 03/17/2023 in [facility]  SDOH Interventions    Food Insecurity Interventions Intervention Not Indicated --  Housing Interventions Intervention Not Indicated --  Transportation Interventions Intervention Not Indicated --  Utilities Interventions Intervention Not Indicated --  Depression Interventions/Treatment  -- Medication       Care Plan  Allergies  Allergen Reactions   Lisinopril Swelling   Silicone Dermatitis    Paper and silk tape OK per patient 04/10/2014   Morphine     Very hard to wake up afterwards   Fluconazole Rash   Penicillin G Rash   Tape Dermatitis and Rash    Oozing    Medications Reviewed Today     Reviewed by Gabriela Dach, RN (Registered Nurse) on 03/25/23 at 1519  Med List Status: <None>   Medication Order Taking? Sig Documenting  Provider Last Dose Status Informant  albuterol (VENTOLIN HFA) 108 (90 Base) MCG/ACT inhaler [id number] Yes Inhale 1-2 puffs into the lungs every 6 (six) hours as needed for shortness of breath. Gabriela Hay, DO Taking Active   citalopram (CELEXA) 20 MG tablet [id number] Yes Take 1 tablet (20 mg total) by mouth daily. Gabriela Hay, DO Taking Active   gabapentin (NEURONTIN) 300 MG capsule [id number] Yes Take 1 capsule (300 mg total) by mouth 3 (three) times daily. Gabriela Hay, DO Taking Active   ibuprofen (ADVIL) 200 MG tablet [id number] Yes Take 600 mg by mouth every 6 (six) hours as needed. [provider] Taking Active   loratadine (CLARITIN) 10 MG tablet [id number] Yes Take 10 mg by mouth daily. [provider] Taking Active   losartan (COZAAR) 25 MG tablet [id number] Yes Take 1 tablet (25 mg total) by mouth daily. Gabriela Hay, DO Taking Active   oxybutynin (DITROPAN-XL) 5 MG 24 hr tablet [id number] Yes Take 1 tablet (5 mg total) by mouth at bedtime. Gabriela Hay, DO Taking Active   Vitamin D, Ergocalciferol, (DRISDOL) 1.25 MG (50000 UNIT) CAPS capsule [id number] Yes Take 1 capsule (50,000 Units total) by mouth every 7 (seven) days. Gabriela Hay, DO Taking Active             Patient Active Problem List   Diagnosis Date Noted   Venous stasis ulcer of left lower extremity (HCC) 03/25/2023   Urinary incontinence 03/25/2023   Peripheral neuropathic pain 03/25/2023   Primary hypertension 03/25/2023   Depression, recurrent (HCC) 03/25/2023   Lymphedema of both lower extremities 03/25/2023  Difficulty maintaining body in lying position 03/25/2023   Chronic fatigue 03/25/2023   Annual physical exam 03/25/2023   Morbid obesity with BMI of 60.0-69.9, adult (HCC) 03/17/2023   Sleep apnea treated with nocturnal bilevel positive airway pressure (BPAP) 03/17/2023   Increased weakness when ambulating 03/17/2023   GERD (gastroesophageal reflux disease) 04/10/2014    History of DVT (deep vein thrombosis) 04/10/2014    Conditions to be addressed/monitored per PCP order:   Lymphedema  Care Plan : RN Care Manager Plan of Care  Updates made by Gabriela Dach, RN since 03/25/2023 12:00 AM     Problem: Health Management needs related to Lymphedema      Long-Range Goal: Development of Plan of Care to address Health Management needs related to Lymphedema   Start Date: 03/25/2023  Expected End Date: 06/23/2023  Note:   Current Barriers:  Chronic Disease Management support and education needs related to Lymphedema  RNCM Clinical Goal(s):  Patient will verbalize understanding of plan for management of Lymphedema as evidenced by patient reports attend all scheduled medical appointments: 03/31/23 with PCP as evidenced by provider documentation in EMR        continue to work with RN Care Manager and/or Social Worker to address care management and care coordination needs related to Lymphedema as evidenced by adherence to CM Team Scheduled appointments     work with Child psychotherapist to address needing assistance applying for disability related to the management of lymphedema as evidenced by review of EMR and patient or Child psychotherapist report       Interventions: Inter-disciplinary care team collaboration (see longitudinal plan of care) Evaluation of current treatment plan related to  self management and patient's adherence to plan as established by provider  Lymphedema  (Status: New goal.) Long Term Goal  Evaluation of current treatment plan related to  Lymphedema ,  self-management and patient's adherence to plan as established by provider. Discussed plans with patient for ongoing care management follow up and provided patient with direct contact information for care management team Provided education to patient re: lymphedema; Reviewed scheduled/upcoming provider appointments including 03/31/23 with PCP for virtual visit; Social Work referral for applying for  disability, scheduled telephone visit on 03/30/23; Assessed social determinant of health barriers;  Advised patient to discuss hospital bed order update with PCP if [facility] does not address during visit on 03/30/23 Discussed referral to LCSW for managing stress related to chronic health issues-patient declines at this time, feels she has a strong support system Reviewed referral for HH-[facility] scheduled to call on 03/29/23 and come to the home on 03/30/23  Patient Goals/Self-Care Activities: Take medications as prescribed   Attend all scheduled provider appointments Call provider office for new concerns or questions  Work with the social worker to address care coordination needs and will continue to work with the clinical team to address health care and disease management related needs       Follow Up:  Patient agrees to Care Plan and Follow-up.  Plan: The Managed Medicaid care management team will reach out to the patient again over the next 14 days.  Date/time of next scheduled RN care management/care coordination outreach:  04/08/23 @ 1:15pm  [name] RN, BSN [place]  Managed [facility] RN Care Coordinator [phone]

## 2023-03-25 NOTE — Assessment & Plan Note (Signed)
Patient has severe lymphedema of her bilateral lower extremities with history of multiple prior surgical excisions.  Will refer patient for home health due to her extreme difficulty leaving her home.  However, we will also have to refer her to occupational therapy due to the need for treatment by a lymphedema specialist.  Will also refer to manage Medicaid care management to facilitate coordination of care and ensure the patient is having the follow-up she needs, especially given that she was lost to follow-up once the pandemic started, which caused her lymphedema to become substantially worse than it could have been has she been consistently treated.

## 2023-03-25 NOTE — Assessment & Plan Note (Signed)
Patient suffered significant urinary incontinence, exacerbated by not having her prescription for oxybutynin, after being lost to follow-up due to COVID.  Refilling her oxybutynin today.

## 2023-03-25 NOTE — Assessment & Plan Note (Signed)
Refilled patient's gabapentin, starting at a lower dose, as I discussed with her during her visit.  Will follow-up with her at a subsequent virtual appointment to see how effective this is for her.

## 2023-03-25 NOTE — Assessment & Plan Note (Addendum)
Physical exam with abnormalities as noted.  Patient has significant difficulty leaving her home due to her extreme lymphedema, which causes her significant neuropathic pain and makes ambulation extremely difficult and tiring, with associated dyspnea on exertion.  Will refer patient for home health services.  -Will also order routine lab work as noted below to have a baseline.  Will screen for hepatitis C and HIV as well as refer patient for mammogram.  Patient does not want colonoscopy but is amenable to screening with a Cologuard test.  Will order this as well.

## 2023-03-25 NOTE — Patient Instructions (Signed)
Visit Information  Ms. Neiss was given information about Medicaid Managed Care team care coordination services as a part of their Healthy Blue Medicaid benefit. Danaiya Steadman Buchheit verbally consented to engagement with the [facility] Managed Care team.   If you are experiencing a medical emergency, please call 911 or report to your local emergency department or urgent care.   If you have a non-emergency medical problem during routine business hours, please contact your provider's office and ask to speak with a nurse.   For questions related to your Healthy [facility] health plan, please call: [phone] or visit the homepage here: [email or web address]  If you would like to schedule transportation through your Healthy [facility] plan, please call the following number at least 2 days in advance of your appointment: [phone]  For information about your ride after you set it up, call Ride Assist at [phone]. Use this number to activate a Will Call pickup, or if your transportation is late for a scheduled pickup. Use this number, too, if you need to make a change or cancel a previously scheduled reservation.  If you need transportation services right away, call [phone]. The after-hours call center is staffed 24 hours to handle ride assistance and urgent reservation requests (including discharges) 365 days a year. Urgent trips include sick visits, hospital discharge requests and life-sustaining treatment.  Call the [facility] Line at [phone], at any time, 24 hours a day, 7 days a week. If you are in danger or need immediate medical attention call 911.  If you would like help to quit smoking, call 1-800-QUIT-NOW (984-347-4563) OR Espaol: 1-855-Djelo-Ya (0-932-355-7322) o para ms informacin haga clic aqu or Text READY to 025-427 to register via text  Ms. Sokol,   Please see education materials related to lymphedema provided  by MyChart link.  Patient verbalizes understanding of instructions and care plan provided today and agrees to view in MyChart. Active MyChart status and patient understanding of how to access instructions and care plan via MyChart confirmed with patient.     Telephone follow up appointment with Managed Medicaid care management team member scheduled for:04/08/23 @ 1:15pm  [name] RN, BSN [place]  Managed [facility] RN Care Coordinator [phone]   Following is a copy of your plan of care:  Care Plan : RN Care Manager Plan of Care  Updates made by [name], RN since 03/25/2023 12:00 AM     Problem: Health Management needs related to Lymphedema      Long-Range Goal: Development of Plan of Care to address Health Management needs related to Lymphedema   Start Date: 03/25/2023  Expected End Date: 06/23/2023  Note:   Current Barriers:  Chronic Disease Management support and education needs related to Lymphedema  RNCM Clinical Goal(s):  Patient will verbalize understanding of plan for management of Lymphedema as evidenced by patient reports attend all scheduled medical appointments: 03/31/23 with PCP as evidenced by provider documentation in EMR        continue to work with RN Care Manager and/or Social Worker to address care management and care coordination needs related to Lymphedema as evidenced by adherence to CM Team Scheduled appointments     work with Child psychotherapist to address needing assistance applying for disability related to the management of lymphedema as evidenced by review of EMR and patient or social worker report       Interventions: Inter-disciplinary care team collaboration (see longitudinal plan of care) Evaluation of current treatment plan related to  self management and patient's adherence to plan as established by provider  Lymphedema  (Status: New goal.) Long Term Goal  Evaluation of current treatment plan related to  Lymphedema ,  self-management and  patient's adherence to plan as established by provider. Discussed plans with patient for ongoing care management follow up and provided patient with direct contact information for care management team Provided education to patient re: lymphedema; Reviewed scheduled/upcoming provider appointments including 03/31/23 with PCP for virtual visit; Social Work referral for applying for disability, scheduled telephone visit on 03/30/23; Assessed social determinant of health barriers;  Advised patient to discuss hospital bed order update with PCP if [facility] does not address during visit on 03/30/23 Discussed referral to LCSW for managing stress related to chronic health issues-patient declines at this time, feels she has a strong support system Reviewed referral for HH-[facility] scheduled to call on 03/29/23 and come to the home on 03/30/23  Patient Goals/Self-Care Activities: Take medications as prescribed   Attend all scheduled provider appointments Call provider office for new concerns or questions  Work with the social worker to address care coordination needs and will continue to work with the clinical team to address health care and disease management related needs

## 2023-03-25 NOTE — Assessment & Plan Note (Signed)
Restarting patient citalopram as noted below.  Patient recalls doing well on it.  Will have patient follow-up via a virtual appointment.

## 2023-03-25 NOTE — Assessment & Plan Note (Signed)
Patient currently utilizing BiPAP every night, throughout the night.

## 2023-03-25 NOTE — Assessment & Plan Note (Signed)
Patient has a significant venous stasis ulcer on the posterior left extremity (approx. 4 inch x 5.5 inch), as well as a smaller stasis ulcer inferior lateral to that (approx. 3/4 inch diameter).  These do not appear infected at this time.  Will plan to have home health address these in her home with regular wound care. Follow with OT for lymphedema treatment will also facilitate healing of these ulcers.

## 2023-03-25 NOTE — Assessment & Plan Note (Signed)
Patient has significant difficulty/inability to lay down beyond a slightly reclined sitting position.  She states that if she does lay down, she is unable to position herself or turn from side-to-side.  She also experiences severe difficulty breathing, likely secondary to her body habitus.  Will order a hospital bed for home use.

## 2023-03-25 NOTE — Assessment & Plan Note (Signed)
Patient experiences significant weakness with ambulation and is unable to ambulate very far prior to needing to rest.

## 2023-03-30 ENCOUNTER — Other Ambulatory Visit: Payer: Medicaid Other

## 2023-03-30 ENCOUNTER — Telehealth: Payer: Self-pay | Admitting: Family Medicine

## 2023-03-30 NOTE — Patient Outreach (Signed)
Medicaid Managed Care Social Work Note  03/30/2023 Name:  Gabriela Mcgee MRN:  [id number] DOB:  December 30, 1970  Gabriela Mcgee is an 52 y.o. year old female who is a primary patient of [name], DO.  The [facility] Managed Care Coordination team was consulted for assistance with:   Applying for disability  Gabriela Mcgee was given information about Medicaid Managed Care Coordination team services today. Gabriela Mcgee Patient agreed to services and verbal consent obtained.  Engaged with patient  for by telephone forinitial visit in response to referral for case management and/or care coordination services.   Assessments/Interventions:  Review of past medical history, allergies, medications, health status, including review of consultants reports, laboratory and other test data, was performed as part of comprehensive evaluation and provision of chronic care management services.  SDOH: (Social Determinant of Health) assessments and interventions performed: SDOH Interventions    Flowsheet Row Patient Outreach Telephone from 03/25/2023 in [place] POPULATION HEALTH DEPARTMENT Office Visit from 03/17/2023 in [facility]  SDOH Interventions    Food Insecurity Interventions Intervention Not Indicated --  Housing Interventions Intervention Not Indicated --  Transportation Interventions Intervention Not Indicated --  Utilities Interventions Intervention Not Indicated --  Depression Interventions/Treatment  -- Medication     BSW completed a telephone outreach with patient for assistance with applying for disability, BSW will mail patient resources that will assist with applying. Patient states the last time she applied for about 5 years ago. No other resources are needed at this time.  Advanced Directives Status:  Not addressed in this encounter.  Care Plan                 Allergies  Allergen Reactions   Lisinopril Swelling   Silicone Dermatitis    Paper and silk  tape OK per patient 04/10/2014   Morphine     Very hard to wake up afterwards   Fluconazole Rash   Penicillin G Rash   Tape Dermatitis and Rash    Oozing    Medications Reviewed Today     Reviewed by [name], DO (Physician) on 03/25/23 at 1652  Med List Status: <None>   Medication Order Taking? Sig Documenting Provider Last Dose Status Informant  albuterol (VENTOLIN HFA) 108 (90 Base) MCG/ACT inhaler [id number] Yes Inhale 1-2 puffs into the lungs every 6 (six) hours as needed for shortness of breath. [name], DO Taking Active   citalopram (CELEXA) 20 MG tablet [id number] Yes Take 1 tablet (20 mg total) by mouth daily. [name], DO Taking Active   gabapentin (NEURONTIN) 300 MG capsule [id number] Yes Take 1 capsule (300 mg total) by mouth 3 (three) times daily. [name], DO Taking Active   ibuprofen (ADVIL) 200 MG tablet [id number] No Take 600 mg by mouth every 6 (six) hours as needed. [provider] Taking Active   loratadine (CLARITIN) 10 MG tablet [id number] No Take 10 mg by mouth daily. [provider] Taking Active   losartan (COZAAR) 25 MG tablet [id number] Yes Take 1 tablet (25 mg total) by mouth daily. [name], DO Taking Active   oxybutynin (DITROPAN-XL) 5 MG 24 hr tablet [id number] Yes Take 1 tablet (5 mg total) by mouth at bedtime. [name], DO Taking Active   Vitamin D, Ergocalciferol, (DRISDOL) 1.25 MG (50000 UNIT) CAPS capsule [id number] Yes Take 1 capsule (50,000 Units total) by mouth every 7 (seven) days. [name], DO Taking Active  Patient Active Problem List   Diagnosis Date Noted   Venous stasis ulcer of left lower extremity (HCC) 03/25/2023   Urinary incontinence 03/25/2023   Peripheral neuropathic pain 03/25/2023   Primary hypertension 03/25/2023   Depression, recurrent (HCC) 03/25/2023   Lymphedema of both lower extremities 03/25/2023   Difficulty maintaining body in lying position  03/25/2023   Chronic fatigue 03/25/2023   Annual physical exam 03/25/2023   Morbid obesity with BMI of 60.0-69.9, adult (HCC) 03/17/2023   Sleep apnea treated with nocturnal bilevel positive airway pressure (BPAP) 03/17/2023   Increased weakness when ambulating 03/17/2023   GERD (gastroesophageal reflux disease) 04/10/2014   History of DVT (deep vein thrombosis) 04/10/2014    Conditions to be addressed/monitored per PCP order:   disability assistance  There are no care plans that you recently modified to display for this patient.   Follow up:  Patient agrees to Care Plan and Follow-up.  Plan: The Managed Medicaid care management team will reach out to the patient again over the next 30 days.  Date/time of next scheduled Social Work care management/care coordination outreach:  04/30/23  Gus Puma, Kenard Gower, [facility]  Managed [facility] Social Worker [phone]

## 2023-03-30 NOTE — Telephone Encounter (Signed)
Trey Paula RN from [employer]: [phone] They have postponed the pt's start of care to 04/01/2023 at the patient's request

## 2023-03-30 NOTE — Patient Instructions (Signed)
Visit Information  [name] was given information about Medicaid Managed Care team care coordination services as a part of their Healthy Blue Medicaid benefit. Gabriela Mcgee verbally consented to engagement with the [facility] Managed Care team.   If you are experiencing a medical emergency, please call 911 or report to your local emergency department or urgent care.   If you have a non-emergency medical problem during routine business hours, please contact your provider's office and ask to speak with a nurse.   For questions related to your Healthy [facility] health plan, please call: [phone] or visit the homepage here: [email or web address]  If you would like to schedule transportation through your Healthy [facility] plan, please call the following number at least 2 days in advance of your appointment: [phone]  For information about your ride after you set it up, call Ride Assist at [phone]. Use this number to activate a Will Call pickup, or if your transportation is late for a scheduled pickup. Use this number, too, if you need to make a change or cancel a previously scheduled reservation.  If you need transportation services right away, call [phone]. The after-hours call center is staffed 24 hours to handle ride assistance and urgent reservation requests (including discharges) 365 days a year. Urgent trips include sick visits, hospital discharge requests and life-sustaining treatment.  Call the [facility] Line at [phone], at any time, 24 hours a day, 7 days a week. If you are in danger or need immediate medical attention call 911.  If you would like help to quit smoking, call 1-800-QUIT-NOW (207-020-3827) OR Espaol: 1-855-Djelo-Ya (3-818-299-3716) o para ms informacin haga clic aqu or Text READY to 967-893 to register via text  [name] - following are the goals we discussed in your visit today:   Goals  Addressed   None       Social Worker will follow up in 30 days .   Gabriela Mcgee, Gabriela Mcgee, MHA [facility]  Managed Medicaid Social Worker [phone]   Following is a copy of your plan of care:  There are no care plans that you recently modified to display for this patient.

## 2023-03-30 NOTE — Progress Notes (Unsigned)
MyChart Video Visit    Virtual Visit via Video Note   This format is felt to be most appropriate for this patient at this time. Physical exam was limited by quality of the video and audio technology used for the visit.   Patient location: her home Provider location: [facility]  I discussed the limitations of evaluation and management by telemedicine and the availability of in person appointments. The patient expressed understanding and agreed to proceed.  Patient: Gabriela Mcgee   DOB: 12/04/1970   52 y.o. Female  MRN: [id number] Visit Date: 03/31/2023  Today's healthcare provider: [name], DO   Chief Complaint  Patient presents with   Depression    Patient was last seen on 03/17/23 and was re-started on her Citalopram.  She states she is feeling much better.  She denies any side effects.  PHQ went from 12 to 7   Subjective    HPI HPI     Depression    Additional comments: Patient was last seen on 03/17/23 and was re-started on her Citalopram.  She states she is feeling much better.  She denies any side effects.  PHQ went from 12 to 7      Last edited by Adline Peals, CMA on 03/31/2023  9:36 AM.      Patient endorses that her mood is significantly improved after restarting the citalopram.  She states that she was always on the same dose and never had to increase.  She does not feel there is a need to increase at this time and believes her dose is optimal.  Regarding home health, they were supposed to call Monday afternoon after 5pm to let her know what time they were going to arrive Tuesday, but he didn't call until Tuesday due to an emergency. She hadn't been feeling well, so when he called, they rescheduled for Thursday.  Since her visit last month, she was able to get her medications, and everything seems to be going well except for the oxybutynin.  She takes it at 9 pm, then it works until 12 pm the next day, at which point she starts having  sudden urges to pee again and is not able to hold it.  Using Dermoplast spray for wounds to prevent them from getting infected.  She reports that they seem to be stable/unchanged.       03/31/2023    9:34 AM 03/17/2023    3:36 PM  Depression screen PHQ 2/9  Decreased Interest 1 2  Down, Depressed, Hopeless 1 1  PHQ - 2 Score 2 3  Altered sleeping 1 2  Tired, decreased energy 1 3  Change in appetite 0 1  Feeling bad or failure about yourself  1 2  Trouble concentrating 1 1  Moving slowly or fidgety/restless 1 0  Suicidal thoughts 0 0  PHQ-9 Score 7 12  Difficult doing work/chores Somewhat difficult Very difficult        03/31/2023    9:34 AM  GAD 7 : Generalized Anxiety Score  Nervous, Anxious, on Edge 0  Control/stop worrying 1  Worry too much - different things 1  Trouble relaxing 0  Restless 0  Easily annoyed or irritable 0  Afraid - awful might happen 0  Total GAD 7 Score 2  Anxiety Difficulty Not difficult at all     Medications: Outpatient Medications Prior to Visit  Medication Sig   albuterol (VENTOLIN HFA) 108 (90 Base) MCG/ACT inhaler Inhale 1-2  puffs into the lungs every 6 (six) hours as needed for shortness of breath.   citalopram (CELEXA) 20 MG tablet Take 1 tablet (20 mg total) by mouth daily.   gabapentin (NEURONTIN) 300 MG capsule Take 1 capsule (300 mg total) by mouth 3 (three) times daily.   ibuprofen (ADVIL) 200 MG tablet Take 600 mg by mouth every 6 (six) hours as needed.   loratadine (CLARITIN) 10 MG tablet Take 10 mg by mouth daily.   losartan (COZAAR) 25 MG tablet Take 1 tablet (25 mg total) by mouth daily.   Vitamin D, Ergocalciferol, (DRISDOL) 1.25 MG (50000 UNIT) CAPS capsule Take 1 capsule (50,000 Units total) by mouth every 7 (seven) days.   [DISCONTINUED] oxybutynin (DITROPAN-XL) 5 MG 24 hr tablet Take 1 tablet (5 mg total) by mouth at bedtime.   No facility-administered medications prior to visit.    Review of Systems  Constitutional:   Negative for chills and fever.  Respiratory:  Negative for chest tightness and shortness of breath.   Cardiovascular:  Negative for chest pain.  Genitourinary:  Positive for enuresis, frequency and urgency. Negative for difficulty urinating and dysuria.       Symptoms occur after 12pm (med wearing off)  Psychiatric/Behavioral:         Improved mood         Objective    There were no vitals taken for this visit.       Physical Exam Constitutional:      General: She is awake. She is not in acute distress.    Appearance: Normal appearance.  HENT:     Head: Normocephalic.  Pulmonary:     Effort: Pulmonary effort is normal. No respiratory distress.  Neurological:     Mental Status: She is alert and oriented to person, place, and time. Mental status is at baseline.        Assessment & Plan    Depression, recurrent [facility]) Assessment & Plan: Patient notes that her depression is significantly improved now that she is back on her citalopram.  Will not make any changes at this time.  Continue citalopram 20 mg daily.   Urinary incontinence, unspecified type Assessment & Plan: Patient is having her oxybutynin wear off approximately 9 hours early, though it is a 24-hour extended release medication.  Considered increasing patient's dose to 10 mg extended release versus utilizing multiple daily dosing of the extended release oxybutynin.  Went ahead and prescribed oxybutynin ER 5 mg twice daily.  Advised patient to take it 9 AM and 9 PM to allow for some overlap and full 24-hour coverage.  Will follow-up at next visit.  Orders: -     oxyBUTYnin Chloride ER; Take 1 tablet (5 mg total) by mouth 2 (two) times daily.  Dispense: 60 tablet; Refill: 2  Lymphedema of both lower extremities Assessment & Plan: Home health has not yet been to the patient's house, as they were scheduled to arrive on the 16th but were unable to call in advance due to an unrelated emergency.  Because of this,  they are scheduled to go tomorrow 04/01/2023.  Patient will also be contacting occupational therapy to ask whether they will see her with the wounds she currently has or whether her wounds will need to be addressed by home health first.  I counseled her on the importance of following up with occupational therapy, as her edema needs to be addressed in order for her wounds to properly heal.      Return  in about 3 months (around 07/01/2023) for HTN, Anx/Dep.     I discussed the assessment and treatment plan with the patient. The patient was provided an opportunity to ask questions and all were answered. The patient agreed with the plan and demonstrated an understanding of the instructions.   The patient was advised to call back or seek an in-person evaluation if the symptoms worsen or if the condition fails to improve as anticipated.  I provided 22 minutes of virtual-face-to-face time during this encounter.  The entirety of the information documented in the History of Present Illness, Review of Systems and Physical Exam were personally obtained by me. Portions of this information were initially documented by the CMA, Adline Peals, and reviewed by me for thoroughness and accuracy.   I discussed the assessment and treatment plan with the patient  The patient was provided an opportunity to ask questions and all were answered. The patient agreed with the plan and demonstrated an understanding of the instructions.   The patient was advised to call back or seek an in-person evaluation if the symptoms worsen or if the condition fails to improve as anticipated.   [name], DO [facility] [phone] (phone) [phone] (fax)  [facility]

## 2023-03-31 ENCOUNTER — Encounter: Payer: Self-pay | Admitting: Family Medicine

## 2023-03-31 ENCOUNTER — Telehealth: Payer: Medicaid Other | Admitting: Family Medicine

## 2023-03-31 DIAGNOSIS — F339 Major depressive disorder, recurrent, unspecified: Secondary | ICD-10-CM

## 2023-03-31 DIAGNOSIS — R32 Unspecified urinary incontinence: Secondary | ICD-10-CM | POA: Diagnosis not present

## 2023-03-31 DIAGNOSIS — I890 Lymphedema, not elsewhere classified: Secondary | ICD-10-CM

## 2023-03-31 MED ORDER — OXYBUTYNIN CHLORIDE ER 5 MG PO TB24
5.0000 mg | ORAL_TABLET | Freq: Two times a day (BID) | ORAL | 2 refills | Status: DC
Start: 2023-03-31 — End: 2023-07-30

## 2023-03-31 NOTE — Assessment & Plan Note (Signed)
Patient notes that her depression is significantly improved now that she is back on her citalopram.  Will not make any changes at this time.  Continue citalopram 20 mg daily.

## 2023-03-31 NOTE — Assessment & Plan Note (Signed)
Patient is having her oxybutynin wear off approximately 9 hours early, though it is a 24-hour extended release medication.  Considered increasing patient's dose to 10 mg extended release versus utilizing multiple daily dosing of the extended release oxybutynin.  Went ahead and prescribed oxybutynin ER 5 mg twice daily.  Advised patient to take it 9 AM and 9 PM to allow for some overlap and full 24-hour coverage.  Will follow-up at next visit.

## 2023-03-31 NOTE — Assessment & Plan Note (Signed)
Home health has not yet been to the patient's house, as they were scheduled to arrive on the 16th but were unable to call in advance due to an unrelated emergency.  Because of this, they are scheduled to go tomorrow 04/01/2023.  Patient will also be contacting occupational therapy to ask whether they will see her with the wounds she currently has or whether her wounds will need to be addressed by home health first.  I counseled her on the importance of following up with occupational therapy, as her edema needs to be addressed in order for her wounds to properly heal.

## 2023-04-01 DIAGNOSIS — Z9181 History of falling: Secondary | ICD-10-CM | POA: Diagnosis not present

## 2023-04-01 DIAGNOSIS — J45909 Unspecified asthma, uncomplicated: Secondary | ICD-10-CM | POA: Diagnosis not present

## 2023-04-01 DIAGNOSIS — Z6841 Body Mass Index (BMI) 40.0 and over, adult: Secondary | ICD-10-CM | POA: Diagnosis not present

## 2023-04-01 DIAGNOSIS — I10 Essential (primary) hypertension: Secondary | ICD-10-CM | POA: Diagnosis not present

## 2023-04-01 DIAGNOSIS — G629 Polyneuropathy, unspecified: Secondary | ICD-10-CM | POA: Diagnosis not present

## 2023-04-01 DIAGNOSIS — G4730 Sleep apnea, unspecified: Secondary | ICD-10-CM | POA: Diagnosis not present

## 2023-04-01 DIAGNOSIS — D649 Anemia, unspecified: Secondary | ICD-10-CM | POA: Diagnosis not present

## 2023-04-01 DIAGNOSIS — I890 Lymphedema, not elsewhere classified: Secondary | ICD-10-CM | POA: Diagnosis not present

## 2023-04-01 DIAGNOSIS — R32 Unspecified urinary incontinence: Secondary | ICD-10-CM | POA: Diagnosis not present

## 2023-04-05 DIAGNOSIS — R32 Unspecified urinary incontinence: Secondary | ICD-10-CM | POA: Diagnosis not present

## 2023-04-05 DIAGNOSIS — J45909 Unspecified asthma, uncomplicated: Secondary | ICD-10-CM | POA: Diagnosis not present

## 2023-04-05 DIAGNOSIS — Z6841 Body Mass Index (BMI) 40.0 and over, adult: Secondary | ICD-10-CM | POA: Diagnosis not present

## 2023-04-05 DIAGNOSIS — G629 Polyneuropathy, unspecified: Secondary | ICD-10-CM | POA: Diagnosis not present

## 2023-04-05 DIAGNOSIS — Z9181 History of falling: Secondary | ICD-10-CM | POA: Diagnosis not present

## 2023-04-05 DIAGNOSIS — D649 Anemia, unspecified: Secondary | ICD-10-CM | POA: Diagnosis not present

## 2023-04-05 DIAGNOSIS — I10 Essential (primary) hypertension: Secondary | ICD-10-CM | POA: Diagnosis not present

## 2023-04-05 DIAGNOSIS — G4730 Sleep apnea, unspecified: Secondary | ICD-10-CM | POA: Diagnosis not present

## 2023-04-05 DIAGNOSIS — I890 Lymphedema, not elsewhere classified: Secondary | ICD-10-CM | POA: Diagnosis not present

## 2023-04-05 DIAGNOSIS — Z1211 Encounter for screening for malignant neoplasm of colon: Secondary | ICD-10-CM | POA: Diagnosis not present

## 2023-04-08 ENCOUNTER — Other Ambulatory Visit: Payer: Medicaid Other | Admitting: *Deleted

## 2023-04-08 NOTE — Patient Instructions (Signed)
Visit Information  Ms. Mcmahen was given information about Medicaid Managed Care team care coordination services as a part of their [employer] Medicaid benefit. Pippa Hanif Mauzy verbally consented to engagement with the [facility] Managed Care team.   If you are experiencing a medical emergency, please call 911 or report to your local emergency department or urgent care.   If you have a non-emergency medical problem during routine business hours, please contact your provider's office and ask to speak with a nurse.   For questions related to your Healthy [facility] health plan, please call: [phone] or visit the homepage here: [email or web address]  If you would like to schedule transportation through your Healthy [facility] plan, please call the following number at least 2 days in advance of your appointment: [phone]  For information about your ride after you set it up, call Ride Assist at [phone]. Use this number to activate a Will Call pickup, or if your transportation is late for a scheduled pickup. Use this number, too, if you need to make a change or cancel a previously scheduled reservation.  If you need transportation services right away, call [phone]. The after-hours call center is staffed 24 hours to handle ride assistance and urgent reservation requests (including discharges) 365 days a year. Urgent trips include sick visits, hospital discharge requests and life-sustaining treatment.  Call the [facility] Line at [phone], at any time, 24 hours a day, 7 days a week. If you are in danger or need immediate medical attention call 911.  If you would like help to quit smoking, call 1-800-QUIT-NOW (843-576-1834) OR Espaol: 1-855-Djelo-Ya (4-742-595-6387) o para ms informacin haga clic aqu or Text READY to 564-332 to register via text  Ms. Gargan,   Please see education materials related to [employer] health  provided by MyChart link.  Patient verbalizes understanding of instructions and care plan provided today and agrees to view in MyChart. Active MyChart status and patient understanding of how to access instructions and care plan via MyChart confirmed with patient.     Telephone follow up appointment with Managed Medicaid care management team member scheduled for:05/12/23 @ 1:15pm  [name] RN, BSN [place]  Managed [facility] RN Care Coordinator [phone]   Following is a copy of your plan of care:  Care Plan : RN Care Manager Plan of Care  Updates made by [name], RN since 04/08/2023 12:00 AM     Problem: Health Management needs related to Lymphedema      Long-Range Goal: Development of Plan of Care to address Health Management needs related to Lymphedema   Start Date: 03/25/2023  Expected End Date: 06/23/2023  Note:   Current Barriers:  Chronic Disease Management support and education needs related to Lymphedema  RNCM Clinical Goal(s):  Patient will verbalize understanding of plan for management of Lymphedema as evidenced by patient reports attend all scheduled medical appointments: follow up with PCP in October as evidenced by provider documentation in EMR        continue to work with RN Care Manager and/or Social Worker to address care management and care coordination needs related to Lymphedema as evidenced by adherence to CM Team Scheduled appointments     work with Child psychotherapist to address needing assistance applying for disability related to the management of lymphedema as evidenced by review of EMR and patient or social worker report       Interventions: Inter-disciplinary care team collaboration (see longitudinal plan of care) Evaluation of current treatment  plan related to  self management and patient's adherence to plan as established by provider  Lymphedema  (Status: Goal on Track (progressing): YES.) Long Term Goal  Evaluation of current treatment plan  related to  Lymphedema ,  self-management and patient's adherence to plan as established by provider. Discussed plans with patient for ongoing care management follow up and provided patient with direct contact information for care management team Provided education to patient re: lymphedema; Reviewed scheduled/upcoming provider appointments including follow up with PCP in October; Assessed social determinant of health barriers;  Ensured receiving HH services with [facility]-patient receiving HH RN and PT [facility] is working on getting patient a hospital bed Advised patient to reach out to [facility] for update Advised patient to contact [employer] [phone] for member benefits  Patient Goals/Self-Care Activities: Take medications as prescribed   Attend all scheduled provider appointments Call provider office for new concerns or questions  Work with the social worker to address care coordination needs and will continue to work with the clinical team to address health care and disease management related needs

## 2023-04-08 NOTE — Patient Outreach (Signed)
Medicaid Managed Care   Nurse Care Manager Note  04/08/2023 Name:  Gabriela Mcgee MRN:  [id number] DOB:  12/14/1970  Gabriela Mcgee is an 52 y.o. year old female who is a primary patient of Gabriela Hay, DO.  The [facility] Managed Care Coordination team was consulted for assistance with:    Lymphedema  Gabriela Mcgee was given information about Medicaid Managed Care Coordination team services today. Gabriela Mcgee Patient agreed to services and verbal consent obtained.  Engaged with patient by telephone for follow up visit in response to provider referral for case management and/or care coordination services.   Assessments/Interventions:  Review of past medical history, allergies, medications, health status, including review of consultants reports, laboratory and other test data, was performed as part of comprehensive evaluation and provision of chronic care management services.  SDOH (Social Determinants of Health) assessments and interventions performed: SDOH Interventions    Flowsheet Row Patient Outreach Telephone from 03/25/2023 in [place] [facility] Office Visit from 03/17/2023 in [facility]  SDOH Interventions    Food Insecurity Interventions Intervention Not Indicated --  Housing Interventions Intervention Not Indicated --  Transportation Interventions Intervention Not Indicated --  Utilities Interventions Intervention Not Indicated --  Depression Interventions/Treatment  -- Medication       Care Plan  Allergies  Allergen Reactions   Lisinopril Swelling   Silicone Dermatitis    Paper and silk tape OK per patient 04/10/2014   Morphine     Very hard to wake up afterwards   Fluconazole Rash   Penicillin G Rash   Tape Dermatitis and Rash    Oozing    Medications Reviewed Today   Medications were not reviewed in this encounter     Patient Active Problem List   Diagnosis Date Noted   Venous stasis ulcer of left lower  extremity (HCC) 03/25/2023   Urinary incontinence 03/25/2023   Peripheral neuropathic pain 03/25/2023   Primary hypertension 03/25/2023   Depression, recurrent (HCC) 03/25/2023   Lymphedema of both lower extremities 03/25/2023   Difficulty maintaining body in lying position 03/25/2023   Chronic fatigue 03/25/2023   Annual physical exam 03/25/2023   Morbid obesity with BMI of 60.0-69.9, adult (HCC) 03/17/2023   Sleep apnea treated with nocturnal bilevel positive airway pressure (BPAP) 03/17/2023   Increased weakness when ambulating 03/17/2023   GERD (gastroesophageal reflux disease) 04/10/2014   History of DVT (deep vein thrombosis) 04/10/2014    Conditions to be addressed/monitored per PCP order:   Lymphedema  Care Plan : RN Care Manager Plan of Care  Updates made by Gabriela Dach, RN since 04/08/2023 12:00 AM     Problem: Health Management needs related to Lymphedema      Long-Range Goal: Development of Plan of Care to address Health Management needs related to Lymphedema   Start Date: 03/25/2023  Expected End Date: 06/23/2023  Note:   Current Barriers:  Chronic Disease Management support and education needs related to Lymphedema  RNCM Clinical Goal(s):  Patient will verbalize understanding of plan for management of Lymphedema as evidenced by patient reports attend all scheduled medical appointments: follow up with PCP in October as evidenced by provider documentation in EMR        continue to work with RN Care Manager and/or Social Worker to address care management and care coordination needs related to Lymphedema as evidenced by adherence to CM Team Scheduled appointments     work with Child psychotherapist to address  needing assistance applying for disability related to the management of lymphedema as evidenced by review of EMR and patient or social worker report       Interventions: Inter-disciplinary care team collaboration (see longitudinal plan of care) Evaluation of current  treatment plan related to  self management and patient's adherence to plan as established by provider  Lymphedema  (Status: Goal on Track (progressing): YES.) Long Term Goal  Evaluation of current treatment plan related to  Lymphedema ,  self-management and patient's adherence to plan as established by provider. Discussed plans with patient for ongoing care management follow up and provided patient with direct contact information for care management team Provided education to patient re: lymphedema; Reviewed scheduled/upcoming provider appointments including follow up with PCP in October; Assessed social determinant of health barriers;  Ensured receiving HH services with [facility]-patient receiving HH RN and PT [facility] is working on getting patient a hospital bed Advised patient to reach out to [facility] for update Advised patient to contact [employer] [phone] for member benefits  Patient Goals/Self-Care Activities: Take medications as prescribed   Attend all scheduled provider appointments Call provider office for new concerns or questions  Work with the social worker to address care coordination needs and will continue to work with the clinical team to address health care and disease management related needs       Follow Up:  Patient agrees to Care Plan and Follow-up.  Plan: The Managed Medicaid care management team will reach out to the patient again over the next 30 days.  Date/time of next scheduled RN care management/care coordination outreach:  05/12/23 @ 1:15pm  Gabriela Emms RN, BSN [place]  Managed [facility] RN Care Coordinator [phone]

## 2023-04-09 DIAGNOSIS — G4730 Sleep apnea, unspecified: Secondary | ICD-10-CM | POA: Diagnosis not present

## 2023-04-09 DIAGNOSIS — R32 Unspecified urinary incontinence: Secondary | ICD-10-CM | POA: Diagnosis not present

## 2023-04-09 DIAGNOSIS — D649 Anemia, unspecified: Secondary | ICD-10-CM | POA: Diagnosis not present

## 2023-04-09 DIAGNOSIS — J45909 Unspecified asthma, uncomplicated: Secondary | ICD-10-CM | POA: Diagnosis not present

## 2023-04-09 DIAGNOSIS — I890 Lymphedema, not elsewhere classified: Secondary | ICD-10-CM | POA: Diagnosis not present

## 2023-04-09 DIAGNOSIS — Z9181 History of falling: Secondary | ICD-10-CM | POA: Diagnosis not present

## 2023-04-09 DIAGNOSIS — G629 Polyneuropathy, unspecified: Secondary | ICD-10-CM | POA: Diagnosis not present

## 2023-04-09 DIAGNOSIS — Z6841 Body Mass Index (BMI) 40.0 and over, adult: Secondary | ICD-10-CM | POA: Diagnosis not present

## 2023-04-09 DIAGNOSIS — I10 Essential (primary) hypertension: Secondary | ICD-10-CM | POA: Diagnosis not present

## 2023-04-12 DIAGNOSIS — I10 Essential (primary) hypertension: Secondary | ICD-10-CM | POA: Diagnosis not present

## 2023-04-12 DIAGNOSIS — I890 Lymphedema, not elsewhere classified: Secondary | ICD-10-CM | POA: Diagnosis not present

## 2023-04-12 DIAGNOSIS — G4730 Sleep apnea, unspecified: Secondary | ICD-10-CM | POA: Diagnosis not present

## 2023-04-12 DIAGNOSIS — D649 Anemia, unspecified: Secondary | ICD-10-CM | POA: Diagnosis not present

## 2023-04-12 DIAGNOSIS — Z9181 History of falling: Secondary | ICD-10-CM | POA: Diagnosis not present

## 2023-04-12 DIAGNOSIS — G629 Polyneuropathy, unspecified: Secondary | ICD-10-CM | POA: Diagnosis not present

## 2023-04-12 DIAGNOSIS — R32 Unspecified urinary incontinence: Secondary | ICD-10-CM | POA: Diagnosis not present

## 2023-04-12 DIAGNOSIS — J45909 Unspecified asthma, uncomplicated: Secondary | ICD-10-CM | POA: Diagnosis not present

## 2023-04-12 DIAGNOSIS — Z6841 Body Mass Index (BMI) 40.0 and over, adult: Secondary | ICD-10-CM | POA: Diagnosis not present

## 2023-04-13 ENCOUNTER — Encounter: Payer: Self-pay | Admitting: Family Medicine

## 2023-04-13 DIAGNOSIS — J45909 Unspecified asthma, uncomplicated: Secondary | ICD-10-CM | POA: Diagnosis not present

## 2023-04-13 DIAGNOSIS — Z9181 History of falling: Secondary | ICD-10-CM | POA: Diagnosis not present

## 2023-04-13 DIAGNOSIS — M792 Neuralgia and neuritis, unspecified: Secondary | ICD-10-CM

## 2023-04-13 DIAGNOSIS — R32 Unspecified urinary incontinence: Secondary | ICD-10-CM | POA: Diagnosis not present

## 2023-04-13 DIAGNOSIS — D649 Anemia, unspecified: Secondary | ICD-10-CM | POA: Diagnosis not present

## 2023-04-13 DIAGNOSIS — I10 Essential (primary) hypertension: Secondary | ICD-10-CM | POA: Diagnosis not present

## 2023-04-13 DIAGNOSIS — G4730 Sleep apnea, unspecified: Secondary | ICD-10-CM | POA: Diagnosis not present

## 2023-04-13 DIAGNOSIS — G629 Polyneuropathy, unspecified: Secondary | ICD-10-CM | POA: Diagnosis not present

## 2023-04-13 DIAGNOSIS — I890 Lymphedema, not elsewhere classified: Secondary | ICD-10-CM | POA: Diagnosis not present

## 2023-04-13 DIAGNOSIS — Z6841 Body Mass Index (BMI) 40.0 and over, adult: Secondary | ICD-10-CM | POA: Diagnosis not present

## 2023-04-15 DIAGNOSIS — R32 Unspecified urinary incontinence: Secondary | ICD-10-CM | POA: Diagnosis not present

## 2023-04-15 DIAGNOSIS — G4730 Sleep apnea, unspecified: Secondary | ICD-10-CM | POA: Diagnosis not present

## 2023-04-15 DIAGNOSIS — I890 Lymphedema, not elsewhere classified: Secondary | ICD-10-CM | POA: Diagnosis not present

## 2023-04-15 DIAGNOSIS — Z6841 Body Mass Index (BMI) 40.0 and over, adult: Secondary | ICD-10-CM | POA: Diagnosis not present

## 2023-04-15 DIAGNOSIS — G629 Polyneuropathy, unspecified: Secondary | ICD-10-CM | POA: Diagnosis not present

## 2023-04-15 DIAGNOSIS — Z9181 History of falling: Secondary | ICD-10-CM | POA: Diagnosis not present

## 2023-04-15 DIAGNOSIS — I10 Essential (primary) hypertension: Secondary | ICD-10-CM | POA: Diagnosis not present

## 2023-04-15 DIAGNOSIS — J45909 Unspecified asthma, uncomplicated: Secondary | ICD-10-CM | POA: Diagnosis not present

## 2023-04-15 DIAGNOSIS — D649 Anemia, unspecified: Secondary | ICD-10-CM | POA: Diagnosis not present

## 2023-04-16 DIAGNOSIS — R32 Unspecified urinary incontinence: Secondary | ICD-10-CM | POA: Diagnosis not present

## 2023-04-16 DIAGNOSIS — G4730 Sleep apnea, unspecified: Secondary | ICD-10-CM | POA: Diagnosis not present

## 2023-04-16 DIAGNOSIS — I10 Essential (primary) hypertension: Secondary | ICD-10-CM | POA: Diagnosis not present

## 2023-04-16 DIAGNOSIS — J45909 Unspecified asthma, uncomplicated: Secondary | ICD-10-CM | POA: Diagnosis not present

## 2023-04-16 DIAGNOSIS — G629 Polyneuropathy, unspecified: Secondary | ICD-10-CM | POA: Diagnosis not present

## 2023-04-16 DIAGNOSIS — Z6841 Body Mass Index (BMI) 40.0 and over, adult: Secondary | ICD-10-CM | POA: Diagnosis not present

## 2023-04-16 DIAGNOSIS — I890 Lymphedema, not elsewhere classified: Secondary | ICD-10-CM | POA: Diagnosis not present

## 2023-04-16 DIAGNOSIS — Z9181 History of falling: Secondary | ICD-10-CM | POA: Diagnosis not present

## 2023-04-16 DIAGNOSIS — D649 Anemia, unspecified: Secondary | ICD-10-CM | POA: Diagnosis not present

## 2023-04-19 DIAGNOSIS — Z9181 History of falling: Secondary | ICD-10-CM | POA: Diagnosis not present

## 2023-04-19 DIAGNOSIS — J45909 Unspecified asthma, uncomplicated: Secondary | ICD-10-CM | POA: Diagnosis not present

## 2023-04-19 DIAGNOSIS — D649 Anemia, unspecified: Secondary | ICD-10-CM | POA: Diagnosis not present

## 2023-04-19 DIAGNOSIS — Z6841 Body Mass Index (BMI) 40.0 and over, adult: Secondary | ICD-10-CM | POA: Diagnosis not present

## 2023-04-19 DIAGNOSIS — G629 Polyneuropathy, unspecified: Secondary | ICD-10-CM | POA: Diagnosis not present

## 2023-04-19 DIAGNOSIS — G4730 Sleep apnea, unspecified: Secondary | ICD-10-CM | POA: Diagnosis not present

## 2023-04-19 DIAGNOSIS — I890 Lymphedema, not elsewhere classified: Secondary | ICD-10-CM | POA: Diagnosis not present

## 2023-04-19 DIAGNOSIS — R32 Unspecified urinary incontinence: Secondary | ICD-10-CM | POA: Diagnosis not present

## 2023-04-19 DIAGNOSIS — I10 Essential (primary) hypertension: Secondary | ICD-10-CM | POA: Diagnosis not present

## 2023-04-21 DIAGNOSIS — R32 Unspecified urinary incontinence: Secondary | ICD-10-CM | POA: Diagnosis not present

## 2023-04-21 DIAGNOSIS — J45909 Unspecified asthma, uncomplicated: Secondary | ICD-10-CM | POA: Diagnosis not present

## 2023-04-21 DIAGNOSIS — I890 Lymphedema, not elsewhere classified: Secondary | ICD-10-CM | POA: Diagnosis not present

## 2023-04-21 DIAGNOSIS — Z9181 History of falling: Secondary | ICD-10-CM | POA: Diagnosis not present

## 2023-04-21 DIAGNOSIS — I10 Essential (primary) hypertension: Secondary | ICD-10-CM | POA: Diagnosis not present

## 2023-04-21 DIAGNOSIS — Z6841 Body Mass Index (BMI) 40.0 and over, adult: Secondary | ICD-10-CM | POA: Diagnosis not present

## 2023-04-21 DIAGNOSIS — G4730 Sleep apnea, unspecified: Secondary | ICD-10-CM | POA: Diagnosis not present

## 2023-04-21 DIAGNOSIS — G629 Polyneuropathy, unspecified: Secondary | ICD-10-CM | POA: Diagnosis not present

## 2023-04-21 DIAGNOSIS — D649 Anemia, unspecified: Secondary | ICD-10-CM | POA: Diagnosis not present

## 2023-04-26 DIAGNOSIS — G629 Polyneuropathy, unspecified: Secondary | ICD-10-CM | POA: Diagnosis not present

## 2023-04-26 DIAGNOSIS — I10 Essential (primary) hypertension: Secondary | ICD-10-CM | POA: Diagnosis not present

## 2023-04-26 DIAGNOSIS — G4730 Sleep apnea, unspecified: Secondary | ICD-10-CM | POA: Diagnosis not present

## 2023-04-26 DIAGNOSIS — I890 Lymphedema, not elsewhere classified: Secondary | ICD-10-CM | POA: Diagnosis not present

## 2023-04-26 DIAGNOSIS — J45909 Unspecified asthma, uncomplicated: Secondary | ICD-10-CM | POA: Diagnosis not present

## 2023-04-26 DIAGNOSIS — Z9181 History of falling: Secondary | ICD-10-CM | POA: Diagnosis not present

## 2023-04-26 DIAGNOSIS — R32 Unspecified urinary incontinence: Secondary | ICD-10-CM | POA: Diagnosis not present

## 2023-04-26 DIAGNOSIS — D649 Anemia, unspecified: Secondary | ICD-10-CM | POA: Diagnosis not present

## 2023-04-26 DIAGNOSIS — Z6841 Body Mass Index (BMI) 40.0 and over, adult: Secondary | ICD-10-CM | POA: Diagnosis not present

## 2023-04-29 DIAGNOSIS — G4730 Sleep apnea, unspecified: Secondary | ICD-10-CM | POA: Diagnosis not present

## 2023-04-29 DIAGNOSIS — D649 Anemia, unspecified: Secondary | ICD-10-CM | POA: Diagnosis not present

## 2023-04-29 DIAGNOSIS — Z6841 Body Mass Index (BMI) 40.0 and over, adult: Secondary | ICD-10-CM | POA: Diagnosis not present

## 2023-04-29 DIAGNOSIS — G629 Polyneuropathy, unspecified: Secondary | ICD-10-CM | POA: Diagnosis not present

## 2023-04-29 DIAGNOSIS — Z9181 History of falling: Secondary | ICD-10-CM | POA: Diagnosis not present

## 2023-04-29 DIAGNOSIS — I890 Lymphedema, not elsewhere classified: Secondary | ICD-10-CM | POA: Diagnosis not present

## 2023-04-29 DIAGNOSIS — R32 Unspecified urinary incontinence: Secondary | ICD-10-CM | POA: Diagnosis not present

## 2023-04-29 DIAGNOSIS — I10 Essential (primary) hypertension: Secondary | ICD-10-CM | POA: Diagnosis not present

## 2023-04-29 DIAGNOSIS — J45909 Unspecified asthma, uncomplicated: Secondary | ICD-10-CM | POA: Diagnosis not present

## 2023-04-29 MED ORDER — AMITRIPTYLINE HCL 25 MG PO TABS
25.0000 mg | ORAL_TABLET | Freq: Every day | ORAL | 1 refills | Status: DC
Start: 2023-04-29 — End: 2023-07-30

## 2023-04-30 ENCOUNTER — Other Ambulatory Visit: Payer: Medicaid Other

## 2023-04-30 NOTE — Patient Outreach (Signed)
  Medicaid Managed Care   Unsuccessful Outreach Note  04/30/2023 Name: Gabriela Mcgee MRN: [id number] DOB: [birth year]  Referred by: [name], DO Reason for referral : No chief complaint on file.   An unsuccessful telephone outreach was attempted today. The patient was referred to the case management team for assistance with care management and care coordination.   Follow Up Plan: The care management team will reach out to the patient again over the next 30 days.   Abelino Derrick, MHA [facility]  Managed [facility] Social Worker [phone]

## 2023-04-30 NOTE — Patient Instructions (Signed)
  Medicaid Managed Care   Unsuccessful Outreach Note  04/30/2023 Name: Gabriela Mcgee MRN: [id number] DOB: [birth year]  Referred by: [name], DO Reason for referral : No chief complaint on file.   An unsuccessful telephone outreach was attempted today. The patient was referred to the case management team for assistance with care management and care coordination.   Follow Up Plan: The care management team will reach out to the patient again over the next 30 days.   Abelino Derrick, MHA [facility]  Managed [facility] Social Worker [phone]

## 2023-05-03 DIAGNOSIS — I10 Essential (primary) hypertension: Secondary | ICD-10-CM | POA: Diagnosis not present

## 2023-05-03 DIAGNOSIS — Z9181 History of falling: Secondary | ICD-10-CM | POA: Diagnosis not present

## 2023-05-03 DIAGNOSIS — D649 Anemia, unspecified: Secondary | ICD-10-CM | POA: Diagnosis not present

## 2023-05-03 DIAGNOSIS — G4730 Sleep apnea, unspecified: Secondary | ICD-10-CM | POA: Diagnosis not present

## 2023-05-03 DIAGNOSIS — I890 Lymphedema, not elsewhere classified: Secondary | ICD-10-CM | POA: Diagnosis not present

## 2023-05-03 DIAGNOSIS — Z6841 Body Mass Index (BMI) 40.0 and over, adult: Secondary | ICD-10-CM | POA: Diagnosis not present

## 2023-05-03 DIAGNOSIS — J45909 Unspecified asthma, uncomplicated: Secondary | ICD-10-CM | POA: Diagnosis not present

## 2023-05-03 DIAGNOSIS — G629 Polyneuropathy, unspecified: Secondary | ICD-10-CM | POA: Diagnosis not present

## 2023-05-03 DIAGNOSIS — R32 Unspecified urinary incontinence: Secondary | ICD-10-CM | POA: Diagnosis not present

## 2023-05-05 DIAGNOSIS — R32 Unspecified urinary incontinence: Secondary | ICD-10-CM | POA: Diagnosis not present

## 2023-05-05 DIAGNOSIS — D649 Anemia, unspecified: Secondary | ICD-10-CM | POA: Diagnosis not present

## 2023-05-05 DIAGNOSIS — J45909 Unspecified asthma, uncomplicated: Secondary | ICD-10-CM | POA: Diagnosis not present

## 2023-05-05 DIAGNOSIS — Z9181 History of falling: Secondary | ICD-10-CM | POA: Diagnosis not present

## 2023-05-05 DIAGNOSIS — I890 Lymphedema, not elsewhere classified: Secondary | ICD-10-CM | POA: Diagnosis not present

## 2023-05-05 DIAGNOSIS — G629 Polyneuropathy, unspecified: Secondary | ICD-10-CM | POA: Diagnosis not present

## 2023-05-05 DIAGNOSIS — I10 Essential (primary) hypertension: Secondary | ICD-10-CM | POA: Diagnosis not present

## 2023-05-05 DIAGNOSIS — G4730 Sleep apnea, unspecified: Secondary | ICD-10-CM | POA: Diagnosis not present

## 2023-05-05 DIAGNOSIS — Z6841 Body Mass Index (BMI) 40.0 and over, adult: Secondary | ICD-10-CM | POA: Diagnosis not present

## 2023-05-06 DIAGNOSIS — G629 Polyneuropathy, unspecified: Secondary | ICD-10-CM | POA: Diagnosis not present

## 2023-05-06 DIAGNOSIS — R32 Unspecified urinary incontinence: Secondary | ICD-10-CM | POA: Diagnosis not present

## 2023-05-06 DIAGNOSIS — J45909 Unspecified asthma, uncomplicated: Secondary | ICD-10-CM | POA: Diagnosis not present

## 2023-05-06 DIAGNOSIS — D649 Anemia, unspecified: Secondary | ICD-10-CM | POA: Diagnosis not present

## 2023-05-06 DIAGNOSIS — Z6841 Body Mass Index (BMI) 40.0 and over, adult: Secondary | ICD-10-CM | POA: Diagnosis not present

## 2023-05-06 DIAGNOSIS — I10 Essential (primary) hypertension: Secondary | ICD-10-CM | POA: Diagnosis not present

## 2023-05-06 DIAGNOSIS — G4730 Sleep apnea, unspecified: Secondary | ICD-10-CM | POA: Diagnosis not present

## 2023-05-06 DIAGNOSIS — I890 Lymphedema, not elsewhere classified: Secondary | ICD-10-CM | POA: Diagnosis not present

## 2023-05-06 DIAGNOSIS — Z9181 History of falling: Secondary | ICD-10-CM | POA: Diagnosis not present

## 2023-05-10 ENCOUNTER — Telehealth: Payer: Self-pay

## 2023-05-10 DIAGNOSIS — R32 Unspecified urinary incontinence: Secondary | ICD-10-CM | POA: Diagnosis not present

## 2023-05-10 DIAGNOSIS — G4730 Sleep apnea, unspecified: Secondary | ICD-10-CM | POA: Diagnosis not present

## 2023-05-10 DIAGNOSIS — G629 Polyneuropathy, unspecified: Secondary | ICD-10-CM | POA: Diagnosis not present

## 2023-05-10 DIAGNOSIS — I890 Lymphedema, not elsewhere classified: Secondary | ICD-10-CM | POA: Diagnosis not present

## 2023-05-10 DIAGNOSIS — D649 Anemia, unspecified: Secondary | ICD-10-CM | POA: Diagnosis not present

## 2023-05-10 DIAGNOSIS — Z6841 Body Mass Index (BMI) 40.0 and over, adult: Secondary | ICD-10-CM | POA: Diagnosis not present

## 2023-05-10 DIAGNOSIS — J45909 Unspecified asthma, uncomplicated: Secondary | ICD-10-CM | POA: Diagnosis not present

## 2023-05-10 DIAGNOSIS — Z9181 History of falling: Secondary | ICD-10-CM | POA: Diagnosis not present

## 2023-05-10 DIAGNOSIS — I10 Essential (primary) hypertension: Secondary | ICD-10-CM | POA: Diagnosis not present

## 2023-05-10 NOTE — Telephone Encounter (Signed)
Copied from CRM [id number]. Topic: General - Other >> May 10, 2023  3:11 PM [place] B wrote: Reason for CRM: Trey Paula from [facility] called in about pt getting hospital bed

## 2023-05-11 NOTE — Telephone Encounter (Signed)
Called and spoke with Gabriela Mcgee from [place], who recommended I send the order for the patient's hospital bed to [facility].  I have printed, signed and faxed the order.

## 2023-05-12 ENCOUNTER — Other Ambulatory Visit: Payer: Self-pay | Admitting: Family Medicine

## 2023-05-12 ENCOUNTER — Other Ambulatory Visit: Payer: Medicaid Other | Admitting: *Deleted

## 2023-05-12 DIAGNOSIS — Z9181 History of falling: Secondary | ICD-10-CM | POA: Diagnosis not present

## 2023-05-12 DIAGNOSIS — G629 Polyneuropathy, unspecified: Secondary | ICD-10-CM | POA: Diagnosis not present

## 2023-05-12 DIAGNOSIS — G4730 Sleep apnea, unspecified: Secondary | ICD-10-CM | POA: Diagnosis not present

## 2023-05-12 DIAGNOSIS — I890 Lymphedema, not elsewhere classified: Secondary | ICD-10-CM | POA: Diagnosis not present

## 2023-05-12 DIAGNOSIS — J45909 Unspecified asthma, uncomplicated: Secondary | ICD-10-CM | POA: Diagnosis not present

## 2023-05-12 DIAGNOSIS — R32 Unspecified urinary incontinence: Secondary | ICD-10-CM | POA: Diagnosis not present

## 2023-05-12 DIAGNOSIS — I10 Essential (primary) hypertension: Secondary | ICD-10-CM

## 2023-05-12 DIAGNOSIS — D649 Anemia, unspecified: Secondary | ICD-10-CM | POA: Diagnosis not present

## 2023-05-12 DIAGNOSIS — Z6841 Body Mass Index (BMI) 40.0 and over, adult: Secondary | ICD-10-CM | POA: Diagnosis not present

## 2023-05-12 NOTE — Patient Outreach (Signed)
Medicaid Managed Care   Nurse Care Manager Note  05/12/2023 Name:  Gabriela Mcgee MRN:  [id number] DOB:  June 27, 1971  Gabriela Mcgee is an 52 y.o. year old female who is a primary patient of [name], DO.  The [facility] Managed Care Coordination team was consulted for assistance with:    Lymphedema  Ms. Suthard was given information about Medicaid Managed Care Coordination team services today. [name] Patient agreed to services and verbal consent obtained.  Engaged with patient by telephone for follow up visit in response to provider referral for case management and/or care coordination services.   Assessments/Interventions:  Review of past medical history, allergies, medications, health status, including review of consultants reports, laboratory and other test data, was performed as part of comprehensive evaluation and provision of chronic care management services.  SDOH (Social Determinants of Health) assessments and interventions performed: SDOH Interventions    Flowsheet Row Patient Outreach Telephone from 03/25/2023 in [place] POPULATION HEALTH DEPARTMENT Office Visit from 03/17/2023 in [facility]  SDOH Interventions    Food Insecurity Interventions Intervention Not Indicated --  Housing Interventions Intervention Not Indicated --  Transportation Interventions Intervention Not Indicated --  Utilities Interventions Intervention Not Indicated --  Depression Interventions/Treatment  -- Medication       Care Plan  Allergies  Allergen Reactions   Lisinopril Swelling   Silicone Dermatitis    Paper and silk tape OK per patient 04/10/2014   Morphine     Very hard to wake up afterwards   Fluconazole Rash   Penicillin G Rash   Tape Dermatitis and Rash    Oozing    Medications Reviewed Today   Medications were not reviewed in this encounter     Patient Active Problem List   Diagnosis Date Noted   Venous stasis ulcer of left lower  extremity (HCC) 03/25/2023   Urinary incontinence 03/25/2023   Peripheral neuropathic pain 03/25/2023   Primary hypertension 03/25/2023   Depression, recurrent (HCC) 03/25/2023   Lymphedema of both lower extremities 03/25/2023   Difficulty maintaining body in lying position 03/25/2023   Chronic fatigue 03/25/2023   Annual physical exam 03/25/2023   Morbid obesity with BMI of 60.0-69.9, adult (HCC) 03/17/2023   Sleep apnea treated with nocturnal bilevel positive airway pressure (BPAP) 03/17/2023   Increased weakness when ambulating 03/17/2023   GERD (gastroesophageal reflux disease) 04/10/2014   History of DVT (deep vein thrombosis) 04/10/2014    Conditions to be addressed/monitored per PCP order:   lymphedema  Care Plan : RN Care Manager Plan of Care  Updates made by [name], RN since 05/12/2023 12:00 AM     Problem: Health Management needs related to Lymphedema      Long-Range Goal: Development of Plan of Care to address Health Management needs related to Lymphedema   Start Date: 03/25/2023  Expected End Date: 06/23/2023  Note:   Current Barriers:  Chronic Disease Management support and education needs related to Lymphedema  RNCM Clinical Goal(s):  Patient will verbalize understanding of plan for management of Lymphedema as evidenced by patient reports attend all scheduled medical appointments: follow up with PCP in October as evidenced by provider documentation in EMR        continue to work with RN Care Manager and/or Social Worker to address care management and care coordination needs related to Lymphedema as evidenced by adherence to CM Team Scheduled appointments     work with Child psychotherapist to address  needing assistance applying for disability related to the management of lymphedema as evidenced by review of EMR and patient or social worker report       Interventions: Inter-disciplinary care team collaboration (see longitudinal plan of care) Evaluation of current  treatment plan related to  self management and patient's adherence to plan as established by provider  Lymphedema  (Status: Goal on Track (progressing): YES.) Long Term Goal  Evaluation of current treatment plan related to  Lymphedema ,  self-management and patient's adherence to plan as established by provider. Discussed plans with patient for ongoing care management follow up and provided patient with direct contact information for care management team Provided education to patient re: lymphedema; Reviewed scheduled/upcoming provider appointments including follow up with PCP in October; Assessed social determinant of health barriers;  Ensured receiving HH services with [facility]-patient receiving HH RN twice weekly and PT once weekly, [facility] is working on getting patient a hospital bed Advised patient to contact [employer] [phone] for member benefits Collaborated with [facility] [phone] regarding hospital bed, order has been received, waiting on processing-provided information to patient Rescheduled patient with BSW on 05/20/23  Patient Goals/Self-Care Activities: Take medications as prescribed   Attend all scheduled provider appointments Call provider office for new concerns or questions  Work with the social worker to address care coordination needs and will continue to work with the clinical team to address health care and disease management related needs       Follow Up:  Patient agrees to Care Plan and Follow-up.  Plan: The Managed Medicaid care management team will reach out to the patient again over the next 30 days.  Date/time of next scheduled RN care management/care coordination outreach:  06/15/23 @ 1:15pm  [name] RN, BSN [place]  Value-Based Care [facility] RN Care Coordinator [phone]

## 2023-05-12 NOTE — Patient Instructions (Signed)
Visit Information  Ms. Neugebauer was given information about Medicaid Managed Care team care coordination services as a part of their Healthy Blue Medicaid benefit. Jozee Croll [place] verbally consented to engagement with the [facility] Managed Care team.   If you are experiencing a medical emergency, please call 911 or report to your local emergency department or urgent care.   If you have a non-emergency medical problem during routine business hours, please contact your provider's office and ask to speak with a nurse.   For questions related to your Healthy [facility] health plan, please call: [phone] or visit the homepage here: [email or web address]  If you would like to schedule transportation through your Healthy [facility] plan, please call the following number at least 2 days in advance of your appointment: [phone]  For information about your ride after you set it up, call Ride Assist at [phone]. Use this number to activate a Will Call pickup, or if your transportation is late for a scheduled pickup. Use this number, too, if you need to make a change or cancel a previously scheduled reservation.  If you need transportation services right away, call [phone]. The after-hours call center is staffed 24 hours to handle ride assistance and urgent reservation requests (including discharges) 365 days a year. Urgent trips include sick visits, hospital discharge requests and life-sustaining treatment.  Call the [facility] Line at [phone], at any time, 24 hours a day, 7 days a week. If you are in danger or need immediate medical attention call 911.  If you would like help to quit smoking, call 1-800-QUIT-NOW (437-098-5355) OR Espaol: 1-855-Djelo-Ya (4-742-595-6387) o para ms informacin haga clic aqu or Text READY to 564-332 to register via text  Ms. Carmicheal,   Please see education materials related to wound care provided  by MyChart link.  Patient verbalizes understanding of instructions and care plan provided today and agrees to view in MyChart. Active MyChart status and patient understanding of how to access instructions and care plan via MyChart confirmed with patient.     Telephone follow up appointment with Managed Medicaid care management team member scheduled for:06/15/23 @ 1:15pm  [name] RN, BSN [place]  Value-Based Care Institute [facility] RN Care Coordinator [phone]   Following is a copy of your plan of care:  Care Plan : RN Care Manager Plan of Care  Updates made by [name], RN since 05/12/2023 12:00 AM     Problem: Health Management needs related to Lymphedema      Long-Range Goal: Development of Plan of Care to address Health Management needs related to Lymphedema   Start Date: 03/25/2023  Expected End Date: 06/23/2023  Note:   Current Barriers:  Chronic Disease Management support and education needs related to Lymphedema  RNCM Clinical Goal(s):  Patient will verbalize understanding of plan for management of Lymphedema as evidenced by patient reports attend all scheduled medical appointments: follow up with PCP in October as evidenced by provider documentation in EMR        continue to work with RN Care Manager and/or Social Worker to address care management and care coordination needs related to Lymphedema as evidenced by adherence to CM Team Scheduled appointments     work with Child psychotherapist to address needing assistance applying for disability related to the management of lymphedema as evidenced by review of EMR and patient or social worker report       Interventions: Inter-disciplinary care team collaboration (see longitudinal plan of care) Evaluation  of current treatment plan related to  self management and patient's adherence to plan as established by provider  Lymphedema  (Status: Goal on Track (progressing): YES.) Long Term Goal  Evaluation of  current treatment plan related to  Lymphedema ,  self-management and patient's adherence to plan as established by provider. Discussed plans with patient for ongoing care management follow up and provided patient with direct contact information for care management team Provided education to patient re: lymphedema; Reviewed scheduled/upcoming provider appointments including follow up with PCP in October; Assessed social determinant of health barriers;  Ensured receiving HH services with [facility]-patient receiving HH RN twice weekly and PT once weekly, [facility] is working on getting patient a hospital bed Advised patient to contact [employer] [phone] for member benefits Collaborated with [facility] [phone] regarding hospital bed, order has been received, waiting on processing-provided information to patient Rescheduled patient with BSW on 05/20/23  Patient Goals/Self-Care Activities: Take medications as prescribed   Attend all scheduled provider appointments Call provider office for new concerns or questions  Work with the social worker to address care coordination needs and will continue to work with the clinical team to address health care and disease management related needs

## 2023-05-20 ENCOUNTER — Other Ambulatory Visit: Payer: Medicaid Other

## 2023-05-20 DIAGNOSIS — Z6841 Body Mass Index (BMI) 40.0 and over, adult: Secondary | ICD-10-CM | POA: Diagnosis not present

## 2023-05-20 DIAGNOSIS — G629 Polyneuropathy, unspecified: Secondary | ICD-10-CM | POA: Diagnosis not present

## 2023-05-20 DIAGNOSIS — Z9181 History of falling: Secondary | ICD-10-CM | POA: Diagnosis not present

## 2023-05-20 DIAGNOSIS — J45909 Unspecified asthma, uncomplicated: Secondary | ICD-10-CM | POA: Diagnosis not present

## 2023-05-20 DIAGNOSIS — I10 Essential (primary) hypertension: Secondary | ICD-10-CM | POA: Diagnosis not present

## 2023-05-20 DIAGNOSIS — R32 Unspecified urinary incontinence: Secondary | ICD-10-CM | POA: Diagnosis not present

## 2023-05-20 DIAGNOSIS — I890 Lymphedema, not elsewhere classified: Secondary | ICD-10-CM | POA: Diagnosis not present

## 2023-05-20 DIAGNOSIS — D649 Anemia, unspecified: Secondary | ICD-10-CM | POA: Diagnosis not present

## 2023-05-20 DIAGNOSIS — G4730 Sleep apnea, unspecified: Secondary | ICD-10-CM | POA: Diagnosis not present

## 2023-05-20 NOTE — Patient Outreach (Signed)
  Medicaid Managed Care Social Work Note  05/20/2023 Name:  Gabriela Mcgee MRN:  [id number] DOB:  09/28/1970  Gabriela Mcgee is an 52 y.o. year old female who is a primary patient of Gabriela Hay, DO.  The [facility] Managed Care Coordination team was consulted for assistance with:   disability resources  Gabriela Mcgee was given information about Medicaid Managed Care Coordination team services today. Gabriela Mcgee Patient agreed to services and verbal consent obtained.  Engaged with patient  for by telephone forfollow up visit in response to referral for case management and/or care coordination services.   Assessments/Interventions:  Review of past medical history, allergies, medications, health status, including review of consultants reports, laboratory and other test data, was performed as part of comprehensive evaluation and provision of chronic care management services.  SDOH: (Social Determinant of Health) assessments and interventions performed: SDOH Interventions    Flowsheet Row Patient Outreach Telephone from 03/25/2023 in [place] POPULATION HEALTH DEPARTMENT Office Visit from 03/17/2023 in [facility]  SDOH Interventions    Food Insecurity Interventions Intervention Not Indicated --  Housing Interventions Intervention Not Indicated --  Transportation Interventions Intervention Not Indicated --  Utilities Interventions Intervention Not Indicated --  Depression Interventions/Treatment  -- Medication     BSW completed a telephone outreach with patient, she states she did receive the resources BSW sent her. Patient states she has not had a chance to give them a call yet, but will soon. No other resources are needed at this time. BSW will completed another follow up call.   Advanced Directives Status:  Not addressed in this encounter.  Care Plan                 Allergies  Allergen Reactions   Lisinopril Swelling   Silicone Dermatitis    Paper and  silk tape OK per patient 04/10/2014   Morphine     Very hard to wake up afterwards   Fluconazole Rash   Penicillin G Rash   Tape Dermatitis and Rash    Oozing    Medications Reviewed Today   Medications were not reviewed in this encounter     Patient Active Problem List   Diagnosis Date Noted   Venous stasis ulcer of left lower extremity (HCC) 03/25/2023   Urinary incontinence 03/25/2023   Peripheral neuropathic pain 03/25/2023   Primary hypertension 03/25/2023   Depression, recurrent (HCC) 03/25/2023   Lymphedema of both lower extremities 03/25/2023   Difficulty maintaining body in lying position 03/25/2023   Chronic fatigue 03/25/2023   Annual physical exam 03/25/2023   Morbid obesity with BMI of 60.0-69.9, adult (HCC) 03/17/2023   Sleep apnea treated with nocturnal bilevel positive airway pressure (BPAP) 03/17/2023   Increased weakness when ambulating 03/17/2023   GERD (gastroesophageal reflux disease) 04/10/2014   History of DVT (deep vein thrombosis) 04/10/2014    Conditions to be addressed/monitored per PCP order:   disability resources  There are no care plans that you recently modified to display for this patient.   Follow up:  Patient agrees to Care Plan and Follow-up.  Plan: The Managed Medicaid care management team will reach out to the patient again over the next 30 days.  Date/time of next scheduled Social Work care management/care coordination outreach:  06/21/23 Gabriela Mcgee, Gabriela Mcgee, [facility]  Managed [facility] Social Worker [phone]

## 2023-05-20 NOTE — Patient Instructions (Signed)
Visit Information  Gabriela Mcgee was given information about Medicaid Managed Care team care coordination services as a part of their Healthy Blue Medicaid benefit. Gabriela Mcgee verbally consented to engagement with the [facility] Managed Care team.   If you are experiencing a medical emergency, please call 911 or report to your local emergency department or urgent care.   If you have a non-emergency medical problem during routine business hours, please contact your provider's office and ask to speak with a nurse.   For questions related to your Healthy [facility] health plan, please call: [phone] or visit the homepage here: [email or web address]  If you would like to schedule transportation through your Healthy [facility] plan, please call the following number at least 2 days in advance of your appointment: [phone]  For information about your ride after you set it up, call Ride Assist at [phone]. Use this number to activate a Will Call pickup, or if your transportation is late for a scheduled pickup. Use this number, too, if you need to make a change or cancel a previously scheduled reservation.  If you need transportation services right away, call [phone]. The after-hours call center is staffed 24 hours to handle ride assistance and urgent reservation requests (including discharges) 365 days a year. Urgent trips include sick visits, hospital discharge requests and life-sustaining treatment.  Call the [facility] Line at [phone], at any time, 24 hours a day, 7 days a week. If you are in danger or need immediate medical attention call 911.  If you would like help to quit smoking, call 1-800-QUIT-NOW ((930) 715-1692) OR Espaol: 1-855-Djelo-Ya (6-606-301-6010) o para ms informacin haga clic aqu or Text READY to 932-355 to register via text  Gabriela Mcgee - following are the goals we discussed in your visit today:   Goals  Addressed   None      Social Worker will follow up on 06/21/23.   Gabriela Mcgee, Gabriela Mcgee, MHA [facility]  Managed Medicaid Social Worker [phone]   Following is a copy of your plan of care:  There are no care plans that you recently modified to display for this patient.

## 2023-05-26 DIAGNOSIS — D649 Anemia, unspecified: Secondary | ICD-10-CM | POA: Diagnosis not present

## 2023-05-26 DIAGNOSIS — G629 Polyneuropathy, unspecified: Secondary | ICD-10-CM | POA: Diagnosis not present

## 2023-05-26 DIAGNOSIS — R32 Unspecified urinary incontinence: Secondary | ICD-10-CM | POA: Diagnosis not present

## 2023-05-26 DIAGNOSIS — G4730 Sleep apnea, unspecified: Secondary | ICD-10-CM | POA: Diagnosis not present

## 2023-05-26 DIAGNOSIS — I10 Essential (primary) hypertension: Secondary | ICD-10-CM | POA: Diagnosis not present

## 2023-05-26 DIAGNOSIS — Z9181 History of falling: Secondary | ICD-10-CM | POA: Diagnosis not present

## 2023-05-26 DIAGNOSIS — I890 Lymphedema, not elsewhere classified: Secondary | ICD-10-CM | POA: Diagnosis not present

## 2023-05-26 DIAGNOSIS — Z6841 Body Mass Index (BMI) 40.0 and over, adult: Secondary | ICD-10-CM | POA: Diagnosis not present

## 2023-05-26 DIAGNOSIS — J45909 Unspecified asthma, uncomplicated: Secondary | ICD-10-CM | POA: Diagnosis not present

## 2023-05-27 DIAGNOSIS — R32 Unspecified urinary incontinence: Secondary | ICD-10-CM | POA: Diagnosis not present

## 2023-05-27 DIAGNOSIS — J45909 Unspecified asthma, uncomplicated: Secondary | ICD-10-CM | POA: Diagnosis not present

## 2023-05-27 DIAGNOSIS — Z9181 History of falling: Secondary | ICD-10-CM | POA: Diagnosis not present

## 2023-05-27 DIAGNOSIS — D649 Anemia, unspecified: Secondary | ICD-10-CM | POA: Diagnosis not present

## 2023-05-27 DIAGNOSIS — G4730 Sleep apnea, unspecified: Secondary | ICD-10-CM | POA: Diagnosis not present

## 2023-05-27 DIAGNOSIS — G629 Polyneuropathy, unspecified: Secondary | ICD-10-CM | POA: Diagnosis not present

## 2023-05-27 DIAGNOSIS — I10 Essential (primary) hypertension: Secondary | ICD-10-CM | POA: Diagnosis not present

## 2023-05-27 DIAGNOSIS — I890 Lymphedema, not elsewhere classified: Secondary | ICD-10-CM | POA: Diagnosis not present

## 2023-05-27 DIAGNOSIS — Z6841 Body Mass Index (BMI) 40.0 and over, adult: Secondary | ICD-10-CM | POA: Diagnosis not present

## 2023-06-01 DIAGNOSIS — R32 Unspecified urinary incontinence: Secondary | ICD-10-CM | POA: Diagnosis not present

## 2023-06-01 DIAGNOSIS — D649 Anemia, unspecified: Secondary | ICD-10-CM | POA: Diagnosis not present

## 2023-06-01 DIAGNOSIS — I10 Essential (primary) hypertension: Secondary | ICD-10-CM | POA: Diagnosis not present

## 2023-06-01 DIAGNOSIS — I890 Lymphedema, not elsewhere classified: Secondary | ICD-10-CM | POA: Diagnosis not present

## 2023-06-01 DIAGNOSIS — G4730 Sleep apnea, unspecified: Secondary | ICD-10-CM | POA: Diagnosis not present

## 2023-06-01 DIAGNOSIS — J45909 Unspecified asthma, uncomplicated: Secondary | ICD-10-CM | POA: Diagnosis not present

## 2023-06-01 DIAGNOSIS — Z6841 Body Mass Index (BMI) 40.0 and over, adult: Secondary | ICD-10-CM | POA: Diagnosis not present

## 2023-06-01 DIAGNOSIS — G629 Polyneuropathy, unspecified: Secondary | ICD-10-CM | POA: Diagnosis not present

## 2023-06-01 DIAGNOSIS — Z9181 History of falling: Secondary | ICD-10-CM | POA: Diagnosis not present

## 2023-06-03 DIAGNOSIS — G4730 Sleep apnea, unspecified: Secondary | ICD-10-CM | POA: Diagnosis not present

## 2023-06-03 DIAGNOSIS — Z6841 Body Mass Index (BMI) 40.0 and over, adult: Secondary | ICD-10-CM | POA: Diagnosis not present

## 2023-06-03 DIAGNOSIS — I890 Lymphedema, not elsewhere classified: Secondary | ICD-10-CM | POA: Diagnosis not present

## 2023-06-03 DIAGNOSIS — G629 Polyneuropathy, unspecified: Secondary | ICD-10-CM | POA: Diagnosis not present

## 2023-06-03 DIAGNOSIS — Z9181 History of falling: Secondary | ICD-10-CM | POA: Diagnosis not present

## 2023-06-03 DIAGNOSIS — J45909 Unspecified asthma, uncomplicated: Secondary | ICD-10-CM | POA: Diagnosis not present

## 2023-06-03 DIAGNOSIS — D649 Anemia, unspecified: Secondary | ICD-10-CM | POA: Diagnosis not present

## 2023-06-03 DIAGNOSIS — I10 Essential (primary) hypertension: Secondary | ICD-10-CM | POA: Diagnosis not present

## 2023-06-03 DIAGNOSIS — R32 Unspecified urinary incontinence: Secondary | ICD-10-CM | POA: Diagnosis not present

## 2023-06-04 ENCOUNTER — Telehealth: Payer: Self-pay

## 2023-06-04 DIAGNOSIS — Z9189 Other specified personal risk factors, not elsewhere classified: Secondary | ICD-10-CM

## 2023-06-04 NOTE — Telephone Encounter (Signed)
Just an FYI   Copied from CRM [id number]. Topic: General - Other >> Jun 03, 2023  5:16 PM Ja-Kwan M wrote: Reason for CRM: Randy with [facility] reports that pt had a missed visit for dressing change due to nausea and vomiting. Cb# [phone]

## 2023-06-04 NOTE — Telephone Encounter (Signed)
Needs to be done through [place]   Copied from CRM [id number]. Topic: General - Other >> Jun 03, 2023  2:17 PM [place] B wrote: Reason for CRM: pt called in says was told by [facility] that request for hosp bed would have to be sent in electronically not by fx.

## 2023-06-07 DIAGNOSIS — G4730 Sleep apnea, unspecified: Secondary | ICD-10-CM | POA: Diagnosis not present

## 2023-06-07 DIAGNOSIS — J45909 Unspecified asthma, uncomplicated: Secondary | ICD-10-CM | POA: Diagnosis not present

## 2023-06-07 DIAGNOSIS — D649 Anemia, unspecified: Secondary | ICD-10-CM | POA: Diagnosis not present

## 2023-06-07 DIAGNOSIS — Z9181 History of falling: Secondary | ICD-10-CM | POA: Diagnosis not present

## 2023-06-07 DIAGNOSIS — I890 Lymphedema, not elsewhere classified: Secondary | ICD-10-CM | POA: Diagnosis not present

## 2023-06-07 DIAGNOSIS — G629 Polyneuropathy, unspecified: Secondary | ICD-10-CM | POA: Diagnosis not present

## 2023-06-07 DIAGNOSIS — I10 Essential (primary) hypertension: Secondary | ICD-10-CM | POA: Diagnosis not present

## 2023-06-07 DIAGNOSIS — R32 Unspecified urinary incontinence: Secondary | ICD-10-CM | POA: Diagnosis not present

## 2023-06-07 DIAGNOSIS — Z6841 Body Mass Index (BMI) 40.0 and over, adult: Secondary | ICD-10-CM | POA: Diagnosis not present

## 2023-06-09 DIAGNOSIS — G4730 Sleep apnea, unspecified: Secondary | ICD-10-CM | POA: Diagnosis not present

## 2023-06-09 DIAGNOSIS — Z6841 Body Mass Index (BMI) 40.0 and over, adult: Secondary | ICD-10-CM | POA: Diagnosis not present

## 2023-06-09 DIAGNOSIS — R32 Unspecified urinary incontinence: Secondary | ICD-10-CM | POA: Diagnosis not present

## 2023-06-09 DIAGNOSIS — I10 Essential (primary) hypertension: Secondary | ICD-10-CM | POA: Diagnosis not present

## 2023-06-09 DIAGNOSIS — D649 Anemia, unspecified: Secondary | ICD-10-CM | POA: Diagnosis not present

## 2023-06-09 DIAGNOSIS — G629 Polyneuropathy, unspecified: Secondary | ICD-10-CM | POA: Diagnosis not present

## 2023-06-09 DIAGNOSIS — Z9181 History of falling: Secondary | ICD-10-CM | POA: Diagnosis not present

## 2023-06-09 DIAGNOSIS — I890 Lymphedema, not elsewhere classified: Secondary | ICD-10-CM | POA: Diagnosis not present

## 2023-06-09 DIAGNOSIS — J45909 Unspecified asthma, uncomplicated: Secondary | ICD-10-CM | POA: Diagnosis not present

## 2023-06-10 DIAGNOSIS — R32 Unspecified urinary incontinence: Secondary | ICD-10-CM | POA: Diagnosis not present

## 2023-06-10 DIAGNOSIS — D649 Anemia, unspecified: Secondary | ICD-10-CM | POA: Diagnosis not present

## 2023-06-10 DIAGNOSIS — G629 Polyneuropathy, unspecified: Secondary | ICD-10-CM | POA: Diagnosis not present

## 2023-06-10 DIAGNOSIS — I10 Essential (primary) hypertension: Secondary | ICD-10-CM | POA: Diagnosis not present

## 2023-06-10 DIAGNOSIS — Z6841 Body Mass Index (BMI) 40.0 and over, adult: Secondary | ICD-10-CM | POA: Diagnosis not present

## 2023-06-10 DIAGNOSIS — Z9181 History of falling: Secondary | ICD-10-CM | POA: Diagnosis not present

## 2023-06-10 DIAGNOSIS — G4730 Sleep apnea, unspecified: Secondary | ICD-10-CM | POA: Diagnosis not present

## 2023-06-10 DIAGNOSIS — J45909 Unspecified asthma, uncomplicated: Secondary | ICD-10-CM | POA: Diagnosis not present

## 2023-06-10 DIAGNOSIS — I890 Lymphedema, not elsewhere classified: Secondary | ICD-10-CM | POA: Diagnosis not present

## 2023-06-11 ENCOUNTER — Telehealth: Payer: Self-pay | Admitting: Family Medicine

## 2023-06-11 NOTE — Telephone Encounter (Signed)
Randy from [facility] called stated he was unable to see patient today as she was not home.

## 2023-06-15 ENCOUNTER — Other Ambulatory Visit: Payer: Medicaid Other | Admitting: *Deleted

## 2023-06-15 NOTE — Patient Instructions (Signed)
Visit Information  Gabriela Mcgee was given information about Medicaid Managed Care team care coordination services as a part of their Healthy Blue Medicaid benefit. Gabriela Mcgee verbally consented to engagement with the [facility] Managed Care team.   If you are experiencing a medical emergency, please call 911 or report to your local emergency department or urgent care.   If you have a non-emergency medical problem during routine business hours, please contact your provider's office and ask to speak with a nurse.   For questions related to your Healthy [facility] health plan, please call: [phone] or visit the homepage here: [email or web address]  If you would like to schedule transportation through your Healthy [facility] plan, please call the following number at least 2 days in advance of your appointment: [phone]  For information about your ride after you set it up, call Ride Assist at [phone]. Use this number to activate a Will Call pickup, or if your transportation is late for a scheduled pickup. Use this number, too, if you need to make a change or cancel a previously scheduled reservation.  If you need transportation services right away, call [phone]. The after-hours call center is staffed 24 hours to handle ride assistance and urgent reservation requests (including discharges) 365 days a year. Urgent trips include sick visits, hospital discharge requests and life-sustaining treatment.  Call the [facility] Line at [phone], at any time, 24 hours a day, 7 days a week. If you are in danger or need immediate medical attention call 911.  If you would like help to quit smoking, call 1-800-QUIT-NOW ((617)854-0007) OR Espaol: 1-855-Djelo-Ya (3-016-010-9323) o para ms informacin haga clic aqu or Text READY to 557-322 to register via text  Gabriela Mcgee - following are the goals we discussed in your visit today:   Goals  Addressed   None     Please see education materials related to wound care provided by MyChart link.  Patient verbalizes understanding of instructions and care plan provided today and agrees to view in MyChart. Active MyChart status and patient understanding of how to access instructions and care plan via MyChart confirmed with patient.     Telephone follow up appointment with Managed Medicaid care management team member scheduled for:07/15/23 at 1:15pm  [name] RN, BSN [place]  Value-Based Care Institute [facility] RN Care Coordinator [phone]   Following is a copy of your plan of care:  Care Plan : RN Care Manager Plan of Care  Updates made by [name], RN since 06/15/2023 12:00 AM     Problem: Health Management needs related to Lymphedema      Long-Range Goal: Development of Plan of Care to address Health Management needs related to Lymphedema   Start Date: 03/25/2023  Expected End Date: 06/23/2023  Note:   Current Barriers:  Chronic Disease Management support and education needs related to Lymphedema  RNCM Clinical Goal(s):  Patient will verbalize understanding of plan for management of Lymphedema as evidenced by patient reports attend all scheduled medical appointments: follow up with PCP in October as evidenced by provider documentation in EMR        continue to work with RN Care Manager and/or Social Worker to address care management and care coordination needs related to Lymphedema as evidenced by adherence to CM Team Scheduled appointments     work with Child psychotherapist to address needing assistance applying for disability related to the management of lymphedema as evidenced by review of EMR and patient or  social worker report       Interventions: Inter-disciplinary care team collaboration (see longitudinal plan of care) Evaluation of current treatment plan related to  self management and patient's adherence to plan as established by  provider  Lymphedema  (Status: Goal on Track (progressing): YES.) Long Term Goal  Evaluation of current treatment plan related to  Lymphedema ,  self-management and patient's adherence to plan as established by provider. Discussed plans with patient for ongoing care management follow up and provided patient with direct contact information for care management team Reviewed scheduled/upcoming provider appointments including follow up with PCP in October-needs to schedule; Advised patient to discuss sending new order for bariatric bed to different DME provider([facility] did not have bariatric bed) with provider; Assessed social determinant of health barriers;  Ensured receiving HH services with [facility]-patient receiving HH RN once weekly and PT once weekly Advised patient to follow up with [facility] regarding missed visits-patient unaware of RN visit Advised patient to contact [employer] [phone] for member benefits  Patient Goals/Self-Care Activities: Take medications as prescribed   Attend all scheduled provider appointments Call provider office for new concerns or questions  Work with the social worker to address care coordination needs and will continue to work with the clinical team to address health care and disease management related needs

## 2023-06-15 NOTE — Patient Outreach (Signed)
Medicaid Managed Care   Nurse Care Manager Note  06/15/2023 Name:  Gabriela Mcgee MRN:  [id number] DOB:  06/20/1971  Gabriela Mcgee is an 52 y.o. year old female who is a primary patient of [name], DO.  The [facility] Managed Care Coordination team was consulted for assistance with:    Lymphedema  [name] was given information about Medicaid Managed Care Coordination team services today. [name] Patient agreed to services and verbal consent obtained.  Engaged with patient by telephone for follow up visit in response to provider referral for case management and/or care coordination services.   Assessments/Interventions:  Review of past medical history, allergies, medications, health status, including review of consultants reports, laboratory and other test data, was performed as part of comprehensive evaluation and provision of chronic care management services.  SDOH (Social Determinants of Health) assessments and interventions performed: SDOH Interventions    Flowsheet Row Patient Outreach Telephone from 03/25/2023 in [facility] Office Visit from 03/17/2023 in [facility]  SDOH Interventions    Food Insecurity Interventions Intervention Not Indicated --  Housing Interventions Intervention Not Indicated --  Transportation Interventions Intervention Not Indicated --  Utilities Interventions Intervention Not Indicated --  Depression Interventions/Treatment  -- Medication       Care Plan  Allergies  Allergen Reactions   Lisinopril Swelling   Silicone Dermatitis    Paper and silk tape OK per patient 04/10/2014   Morphine     Very hard to wake up afterwards   Fluconazole Rash   Penicillin G Rash   Tape Dermatitis and Rash    Oozing    Medications Reviewed Today     Reviewed by [name], RN (Registered Nurse) on 06/15/23 at 1355  Med List Status: <None>   Medication Order Taking? Sig Documenting  Provider Last Dose Status Informant  albuterol (VENTOLIN HFA) 108 (90 Base) MCG/ACT inhaler [id number] Yes Inhale 1-2 puffs into the lungs every 6 (six) hours as needed for shortness of breath. [name], DO Taking Active   amitriptyline (ELAVIL) 25 MG tablet [id number] Yes Take 1 tablet (25 mg total) by mouth at bedtime. [name], DO Taking Active   citalopram (CELEXA) 20 MG tablet [id number] Yes Take 1 tablet (20 mg total) by mouth daily. [name], DO Taking Active   gabapentin (NEURONTIN) 300 MG capsule [id number] No Take 1 capsule (300 mg total) by mouth 3 (three) times daily.  Patient not taking: Reported on 06/15/2023   Pardue, Sarah N, DO Not Taking Active   ibuprofen (ADVIL) 200 MG tablet [id number] Yes Take 600 mg by mouth every 6 (six) hours as needed. [provider] Taking Active   loratadine (CLARITIN) 10 MG tablet [id number] Yes Take 10 mg by mouth daily. [provider] Taking Active   losartan (COZAAR) 25 MG tablet [id number] Yes Take 1 tablet by mouth once daily Pardue, Sarah N, DO Taking Active   oxybutynin (DITROPAN-XL) 5 MG 24 hr tablet [id number] Yes Take 1 tablet (5 mg total) by mouth 2 (two) times daily. [name], DO Taking Active   Vitamin D, Ergocalciferol, (DRISDOL) 1.25 MG (50000 UNIT) CAPS capsule [id number] Yes Take 1 capsule (50,000 Units total) by mouth every 7 (seven) days. [name], DO Taking Active             Patient Active Problem List   Diagnosis Date Noted   Venous stasis ulcer of left  lower extremity (HCC) 03/25/2023   Urinary incontinence 03/25/2023   Peripheral neuropathic pain 03/25/2023   Primary hypertension 03/25/2023   Depression, recurrent (HCC) 03/25/2023   Lymphedema of both lower extremities 03/25/2023   Difficulty maintaining body in lying position 03/25/2023   Chronic fatigue 03/25/2023   Annual physical exam 03/25/2023   Morbid obesity with BMI of 60.0-69.9, adult (HCC) 03/17/2023   Sleep  apnea treated with nocturnal bilevel positive airway pressure (BPAP) 03/17/2023   Increased weakness when ambulating 03/17/2023   GERD (gastroesophageal reflux disease) 04/10/2014   History of DVT (deep vein thrombosis) 04/10/2014    Conditions to be addressed/monitored per PCP order:   Lymphedema  Care Plan : RN Care Manager Plan of Care  Updates made by [name], RN since 06/15/2023 12:00 AM     Problem: Health Management needs related to Lymphedema      Long-Range Goal: Development of Plan of Care to address Health Management needs related to Lymphedema   Start Date: 03/25/2023  Expected End Date: 06/23/2023  Note:   Current Barriers:  Chronic Disease Management support and education needs related to Lymphedema  RNCM Clinical Goal(s):  Patient will verbalize understanding of plan for management of Lymphedema as evidenced by patient reports attend all scheduled medical appointments: follow up with PCP in October as evidenced by provider documentation in EMR        continue to work with RN Care Manager and/or Social Worker to address care management and care coordination needs related to Lymphedema as evidenced by adherence to CM Team Scheduled appointments     work with Child psychotherapist to address needing assistance applying for disability related to the management of lymphedema as evidenced by review of EMR and patient or Child psychotherapist report       Interventions: Inter-disciplinary care team collaboration (see longitudinal plan of care) Evaluation of current treatment plan related to  self management and patient's adherence to plan as established by provider  Lymphedema  (Status: Goal on Track (progressing): YES.) Long Term Goal  Evaluation of current treatment plan related to  Lymphedema ,  self-management and patient's adherence to plan as established by provider. Discussed plans with patient for ongoing care management follow up and provided patient with direct contact  information for care management team Reviewed scheduled/upcoming provider appointments including follow up with PCP in October-needs to schedule; Advised patient to discuss sending new order for bariatric bed to different DME provider([facility] did not have bariatric bed) with provider; Assessed social determinant of health barriers;  Ensured receiving HH services with [facility]-patient receiving HH RN once weekly and PT once weekly Advised patient to follow up with [facility] regarding missed visits-patient unaware of RN visit Advised patient to contact [employer] [phone] for member benefits  Patient Goals/Self-Care Activities: Take medications as prescribed   Attend all scheduled provider appointments Call provider office for new concerns or questions  Work with the social worker to address care coordination needs and will continue to work with the clinical team to address health care and disease management related needs       Follow Up:  Patient agrees to Care Plan and Follow-up.  Plan: The Managed Medicaid care management team will reach out to the patient again over the next 30 days.  Date/time of next scheduled RN care management/care coordination outreach:  07/15/23 at 1:15pm  [name] RN, BSN [place]  [facility] RN Care Coordinator [phone]

## 2023-06-16 ENCOUNTER — Encounter: Payer: Self-pay | Admitting: Emergency Medicine

## 2023-06-16 ENCOUNTER — Other Ambulatory Visit: Payer: Self-pay

## 2023-06-16 ENCOUNTER — Inpatient Hospital Stay
Admission: EM | Admit: 2023-06-16 | Discharge: 2023-06-21 | DRG: 603 | Disposition: A | Discharge status: Home Health | Payer: Medicaid Other | Attending: Internal Medicine | Admitting: Internal Medicine

## 2023-06-16 ENCOUNTER — Emergency Department: Payer: Medicaid Other

## 2023-06-16 DIAGNOSIS — S81801A Unspecified open wound, right lower leg, initial encounter: Principal | ICD-10-CM

## 2023-06-16 DIAGNOSIS — L89891 Pressure ulcer of other site, stage 1: Secondary | ICD-10-CM | POA: Diagnosis not present

## 2023-06-16 DIAGNOSIS — Z880 Allergy status to penicillin: Secondary | ICD-10-CM | POA: Diagnosis not present

## 2023-06-16 DIAGNOSIS — G629 Polyneuropathy, unspecified: Secondary | ICD-10-CM | POA: Diagnosis present

## 2023-06-16 DIAGNOSIS — G4730 Sleep apnea, unspecified: Secondary | ICD-10-CM | POA: Diagnosis not present

## 2023-06-16 DIAGNOSIS — R9389 Abnormal findings on diagnostic imaging of other specified body structures: Secondary | ICD-10-CM | POA: Diagnosis not present

## 2023-06-16 DIAGNOSIS — I890 Lymphedema, not elsewhere classified: Secondary | ICD-10-CM | POA: Diagnosis not present

## 2023-06-16 DIAGNOSIS — Z6841 Body Mass Index (BMI) 40.0 and over, adult: Secondary | ICD-10-CM | POA: Diagnosis not present

## 2023-06-16 DIAGNOSIS — G4733 Obstructive sleep apnea (adult) (pediatric): Secondary | ICD-10-CM | POA: Diagnosis not present

## 2023-06-16 DIAGNOSIS — L97119 Non-pressure chronic ulcer of right thigh with unspecified severity: Secondary | ICD-10-CM | POA: Diagnosis not present

## 2023-06-16 DIAGNOSIS — K219 Gastro-esophageal reflux disease without esophagitis: Secondary | ICD-10-CM | POA: Diagnosis not present

## 2023-06-16 DIAGNOSIS — Z91048 Other nonmedicinal substance allergy status: Secondary | ICD-10-CM | POA: Diagnosis not present

## 2023-06-16 DIAGNOSIS — F32A Depression, unspecified: Secondary | ICD-10-CM | POA: Diagnosis not present

## 2023-06-16 DIAGNOSIS — Z79899 Other long term (current) drug therapy: Secondary | ICD-10-CM | POA: Diagnosis not present

## 2023-06-16 DIAGNOSIS — I10 Essential (primary) hypertension: Secondary | ICD-10-CM | POA: Diagnosis not present

## 2023-06-16 DIAGNOSIS — Z9181 History of falling: Secondary | ICD-10-CM | POA: Diagnosis not present

## 2023-06-16 DIAGNOSIS — L03115 Cellulitis of right lower limb: Principal | ICD-10-CM | POA: Diagnosis present

## 2023-06-16 DIAGNOSIS — Z883 Allergy status to other anti-infective agents status: Secondary | ICD-10-CM

## 2023-06-16 DIAGNOSIS — Z8249 Family history of ischemic heart disease and other diseases of the circulatory system: Secondary | ICD-10-CM | POA: Diagnosis not present

## 2023-06-16 DIAGNOSIS — Z23 Encounter for immunization: Secondary | ICD-10-CM

## 2023-06-16 DIAGNOSIS — Z888 Allergy status to other drugs, medicaments and biological substances status: Secondary | ICD-10-CM | POA: Diagnosis not present

## 2023-06-16 DIAGNOSIS — G5793 Unspecified mononeuropathy of bilateral lower limbs: Secondary | ICD-10-CM | POA: Diagnosis not present

## 2023-06-16 DIAGNOSIS — J45909 Unspecified asthma, uncomplicated: Secondary | ICD-10-CM | POA: Diagnosis not present

## 2023-06-16 DIAGNOSIS — R32 Unspecified urinary incontinence: Secondary | ICD-10-CM | POA: Diagnosis not present

## 2023-06-16 DIAGNOSIS — R229 Localized swelling, mass and lump, unspecified: Secondary | ICD-10-CM | POA: Diagnosis not present

## 2023-06-16 DIAGNOSIS — R58 Hemorrhage, not elsewhere classified: Secondary | ICD-10-CM | POA: Diagnosis not present

## 2023-06-16 DIAGNOSIS — Z885 Allergy status to narcotic agent status: Secondary | ICD-10-CM

## 2023-06-16 DIAGNOSIS — D649 Anemia, unspecified: Secondary | ICD-10-CM | POA: Diagnosis not present

## 2023-06-16 LAB — COMPREHENSIVE METABOLIC PANEL
ALT: 8 U/L (ref 0–44)
AST: 12 U/L — ABNORMAL LOW (ref 15–41)
Albumin: 3.5 g/dL (ref 3.5–5.0)
Alkaline Phosphatase: 58 U/L (ref 38–126)
Anion gap: 3 — ABNORMAL LOW (ref 5–15)
BUN: 11 mg/dL (ref 6–20)
CO2: 30 mmol/L (ref 22–32)
Calcium: 8.9 mg/dL (ref 8.9–10.3)
Chloride: 105 mmol/L (ref 98–111)
Creatinine, Ser: 0.65 mg/dL (ref 0.44–1.00)
GFR, Estimated: >60 mL/min (ref >60)
Glucose, Bld: 103 mg/dL — ABNORMAL HIGH (ref 70–99)
Potassium: 4 mmol/L (ref 3.5–5.1)
Sodium: 138 mmol/L (ref 135–145)
Total Bilirubin: 1 mg/dL (ref 0.3–1.2)
Total Protein: 7.8 g/dL (ref 6.5–8.1)

## 2023-06-16 LAB — CBC WITH DIFFERENTIAL/PLATELET
Abs Immature Granulocytes: 0.01 10*3/uL (ref 0.00–0.07)
Basophils Absolute: 0.1 10*3/uL (ref 0.0–0.1)
Basophils Relative: 1 %
Eosinophils Absolute: 0.4 10*3/uL (ref 0.0–0.5)
Eosinophils Relative: 7 %
HCT: 34.9 % — ABNORMAL LOW (ref 36.0–46.0)
Hemoglobin: 11.4 g/dL — ABNORMAL LOW (ref 12.0–15.0)
Immature Granulocytes: 0 %
Lymphocytes Relative: 18 %
Lymphs Abs: 1.1 10*3/uL (ref 0.7–4.0)
MCH: 24.6 pg — ABNORMAL LOW (ref 26.0–34.0)
MCHC: 32.7 g/dL (ref 30.0–36.0)
MCV: 75.2 fL — ABNORMAL LOW (ref 80.0–100.0)
Monocytes Absolute: 0.5 10*3/uL (ref 0.1–1.0)
Monocytes Relative: 9 %
Neutro Abs: 4 10*3/uL (ref 1.7–7.7)
Neutrophils Relative %: 65 %
Platelets: 153 10*3/uL (ref 150–400)
RBC: 4.64 MIL/uL (ref 3.87–5.11)
RDW: 15.3 % (ref 11.5–15.5)
WBC: 6.1 10*3/uL (ref 4.0–10.5)
nRBC: 0 % (ref 0.0–0.2)

## 2023-06-16 LAB — LACTIC ACID, PLASMA
Lactic Acid, Venous: 1.1 mmol/L (ref 0.5–1.9)
Lactic Acid, Venous: 1 mmol/L (ref 0.5–1.9)

## 2023-06-16 LAB — SEDIMENTATION RATE: Sed Rate: 35 mm/h — ABNORMAL HIGH (ref 0–30)

## 2023-06-16 MED ORDER — ACETAMINOPHEN 650 MG RE SUPP: 650.0000 mg | Freq: Four times a day (QID) | RECTAL | Status: DC | PRN

## 2023-06-16 MED ORDER — IOHEXOL 350 MG/ML SOLN
100.0000 mL | Freq: Once | INTRAVENOUS | Status: AC | PRN
  Administered 2023-06-16: 100 mL via INTRAVENOUS

## 2023-06-16 MED ORDER — GABAPENTIN 300 MG PO CAPS
300.0000 mg | ORAL_CAPSULE | Freq: Three times a day (TID) | ORAL | Status: DC
  Administered 2023-06-18 – 2023-06-20 (×2): 300 mg via ORAL
  Filled 2023-06-16 (×12): qty 1

## 2023-06-16 MED ORDER — ONDANSETRON HCL 4 MG PO TABS: 4.0000 mg | ORAL_TABLET | Freq: Four times a day (QID) | ORAL | Status: DC | PRN

## 2023-06-16 MED ORDER — VANCOMYCIN HCL IN DEXTROSE 1-5 GM/200ML-% IV SOLN: 1000.0000 mg | Freq: Once | INTRAVENOUS | Status: DC

## 2023-06-16 MED ORDER — VANCOMYCIN HCL IN DEXTROSE 1-5 GM/200ML-% IV SOLN
1000.0000 mg | Freq: Once | INTRAVENOUS | Status: AC
  Administered 2023-06-16: 1000 mg via INTRAVENOUS
  Filled 2023-06-16: qty 200

## 2023-06-16 MED ORDER — VITAMIN D (ERGOCALCIFEROL) 1.25 MG (50000 UNIT) PO CAPS
50000.0000 [IU] | ORAL_CAPSULE | ORAL | Status: DC
  Administered 2023-06-16: 50000 [IU] via ORAL
  Filled 2023-06-16 (×3): qty 1

## 2023-06-16 MED ORDER — SODIUM CHLORIDE 0.9 % IV SOLN
2.0000 g | INTRAVENOUS | Status: DC
  Administered 2023-06-17 – 2023-06-21 (×5): 2 g via INTRAVENOUS
  Filled 2023-06-16 (×5): qty 20

## 2023-06-16 MED ORDER — ONDANSETRON HCL 4 MG/2ML IJ SOLN: 4.0000 mg | Freq: Four times a day (QID) | INTRAMUSCULAR | Status: DC | PRN

## 2023-06-16 MED ORDER — ACETAMINOPHEN 325 MG PO TABS
650.0000 mg | ORAL_TABLET | Freq: Four times a day (QID) | ORAL | Status: DC | PRN
  Administered 2023-06-17 – 2023-06-19 (×2): 650 mg via ORAL
  Filled 2023-06-16 (×2): qty 2

## 2023-06-16 MED ORDER — SODIUM CHLORIDE 0.9 % IV SOLN: INTRAVENOUS | Status: DC

## 2023-06-16 MED ORDER — VANCOMYCIN HCL 1250 MG/250ML IV SOLN
1250.0000 mg | Freq: Two times a day (BID) | INTRAVENOUS | Status: DC
  Administered 2023-06-17 – 2023-06-18 (×3): 1250 mg via INTRAVENOUS
  Filled 2023-06-16 (×3): qty 250

## 2023-06-16 MED ORDER — OXYBUTYNIN CHLORIDE ER 5 MG PO TB24
5.0000 mg | ORAL_TABLET | Freq: Two times a day (BID) | ORAL | Status: DC
  Administered 2023-06-16 – 2023-06-21 (×10): 5 mg via ORAL
  Filled 2023-06-16 (×11): qty 1

## 2023-06-16 MED ORDER — METRONIDAZOLE 500 MG/100ML IV SOLN
500.0000 mg | Freq: Once | INTRAVENOUS | Status: AC
  Administered 2023-06-16: 500 mg via INTRAVENOUS
  Filled 2023-06-16: qty 100

## 2023-06-16 MED ORDER — ALBUTEROL SULFATE (2.5 MG/3ML) 0.083% IN NEBU: 2.5000 mg | INHALATION_SOLUTION | Freq: Four times a day (QID) | RESPIRATORY_TRACT | Status: DC | PRN

## 2023-06-16 MED ORDER — ENOXAPARIN SODIUM 120 MG/0.8ML IJ SOSY
0.5000 mg/kg | PREFILLED_SYRINGE | INTRAMUSCULAR | Status: DC
  Administered 2023-06-16 – 2023-06-20 (×5): 102 mg via SUBCUTANEOUS
  Filled 2023-06-16 (×6): qty 0.68

## 2023-06-16 MED ORDER — LORATADINE 10 MG PO TABS
10.0000 mg | ORAL_TABLET | Freq: Every day | ORAL | Status: DC
  Administered 2023-06-16 – 2023-06-21 (×6): 10 mg via ORAL
  Filled 2023-06-16 (×6): qty 1

## 2023-06-16 MED ORDER — TRAZODONE HCL 50 MG PO TABS
25.0000 mg | ORAL_TABLET | Freq: Every evening | ORAL | Status: DC | PRN
  Administered 2023-06-18: 25 mg via ORAL
  Filled 2023-06-16 (×2): qty 1

## 2023-06-16 MED ORDER — VANCOMYCIN HCL 1500 MG/300ML IV SOLN
1500.0000 mg | Freq: Once | INTRAVENOUS | Status: AC
  Administered 2023-06-16: 1500 mg via INTRAVENOUS
  Filled 2023-06-16 (×2): qty 300

## 2023-06-16 MED ORDER — MAGNESIUM HYDROXIDE 400 MG/5ML PO SUSP
30.0000 mL | Freq: Every day | ORAL | Status: DC | PRN
  Administered 2023-06-20: 30 mL via ORAL
  Filled 2023-06-16: qty 30

## 2023-06-16 MED ORDER — SODIUM CHLORIDE 0.9 % IV SOLN
2.0000 g | Freq: Once | INTRAVENOUS | Status: AC
  Administered 2023-06-16: 2 g via INTRAVENOUS
  Filled 2023-06-16: qty 12.5

## 2023-06-16 MED ORDER — CITALOPRAM HYDROBROMIDE 10 MG PO TABS
20.0000 mg | ORAL_TABLET | Freq: Every day | ORAL | Status: DC
  Administered 2023-06-17 – 2023-06-21 (×5): 20 mg via ORAL
  Filled 2023-06-16 (×6): qty 2

## 2023-06-16 NOTE — Assessment & Plan Note (Signed)
- 

## 2023-06-16 NOTE — Assessment & Plan Note (Addendum)
-   The patient will be admitted to a medical bed. - Will continue antibiotic therapy with IV vancomycin and Rocephin. - General Surgery consult will be obtained for potential pending abscess. - I notified Dr. Maurine Minister about the patient - Wound Gram stain and culture will be obtained. - Will follow blood cultures.

## 2023-06-16 NOTE — Assessment & Plan Note (Signed)
Will obtain a pelvic CT scan for further assessment.

## 2023-06-16 NOTE — ED Triage Notes (Signed)
Pt in with wound to groin/underside of R thigh, hx of chronic lymphedema. Pt states she has home health nurse come out daily and states the wound is now malodorous with necrotic-appearing tissue.

## 2023-06-16 NOTE — ED Provider Notes (Signed)
[facility] Provider Note    Event Date/Time   First MD Initiated Contact with Patient 06/16/23 1608     (approximate)   History   Wound Check   HPI  Gabriela Mcgee is a 52 y.o. female with history of morbid obesity, lymphedema, here with wound to her right leg.  The patient states that she has had some drainage of her right leg that has been fairly persistent over the last week or 2.  However, over the last 24 hours, the pain has significantly worsened and she has now gotten foul-smelling drainage.  She states that her home health nurse came to check on it today and he sent her to the ED.  She states that it has gotten more painful and foul-smelling.  She is unable to see it or get regular wound care.  She also states that her bed and bariatric chair had recently broken, so she has been unable to take much pressure off of it.     Physical Exam   Triage Vital Signs: ED Triage Vitals [06/16/23 1515]  Encounter Vitals Group     BP 117/66     Systolic BP Percentile      Diastolic BP Percentile      Pulse Rate 86     Resp 18     Temp 98.6 F (37 C)     Temp Source Oral     SpO2 98 %     Weight (!) 452 lb 13.2 oz (205.4 kg)     Height      Head Circumference      Peak Flow      Pain Score 10     Pain Loc      Pain Education      Exclude from Growth Chart     Most recent vital signs: Vitals:   06/16/23 2011 06/16/23 2032  BP:  (!) 142/80  Pulse:  82  Resp:  20  Temp: 98.5 F (36.9 C) 98.2 F (36.8 C)  SpO2:  97%     General: Awake, no distress.  CV:  Good peripheral perfusion.  Regular rate and rhythm. Resp:  Normal work of breathing.  Abd:  No distention.  Other:  Severe bilateral lymphedema with diffuse soft tissue swelling and changes.  Along the right medial thigh, there is a firm, soft tissue mass or nodule versus layer of lymphedema, with an open, draining, foul-smelling wound along the inferior aspect which contacts the bed.  See  image.   ED Results / Procedures / Treatments   Labs (all labs ordered are listed, but only abnormal results are displayed) Labs Reviewed  COMPREHENSIVE METABOLIC PANEL - Abnormal; Notable for the following components:      Result Value   Glucose, Bld 103 (*)    AST 12 (*)    Anion gap 3 (*)    All other components within normal limits  CBC WITH DIFFERENTIAL/PLATELET - Abnormal; Notable for the following components:   Hemoglobin 11.4 (*)    HCT 34.9 (*)    MCV 75.2 (*)    MCH 24.6 (*)    All other components within normal limits  SEDIMENTATION RATE - Abnormal; Notable for the following components:   Sed Rate 35 (*)    All other components within normal limits  CULTURE, BLOOD (ROUTINE X 2)  CULTURE, BLOOD (ROUTINE X 2)  LACTIC ACID, PLASMA  LACTIC ACID, PLASMA  URINALYSIS, W/ REFLEX TO CULTURE (INFECTION SUSPECTED)  BASIC METABOLIC PANEL  CBC     EKG    RADIOLOGY CT: Extensive soft tissue cellulitis, no walled abscess   I also independently reviewed and agree with radiologist interpretations.   PROCEDURES:  Critical Care performed: No  MEDICATIONS ORDERED IN ED: Medications  enoxaparin (LOVENOX) injection 102 mg (102 mg Subcutaneous Given 06/16/23 2037)  0.9 %  sodium chloride infusion ( Intravenous New Bag/Given 06/16/23 2112)  acetaminophen (TYLENOL) tablet 650 mg (has no administration in time range)    Or  acetaminophen (TYLENOL) suppository 650 mg (has no administration in time range)  traZODone (DESYREL) tablet 25 mg (has no administration in time range)  ondansetron (ZOFRAN) tablet 4 mg (has no administration in time range)    Or  ondansetron (ZOFRAN) injection 4 mg (has no administration in time range)  magnesium hydroxide (MILK OF MAGNESIA) suspension 30 mL (has no administration in time range)  citalopram (CELEXA) tablet 20 mg (20 mg Oral Not Given 06/16/23 2114)  oxybutynin (DITROPAN-XL) 24 hr tablet 5 mg (5 mg Oral Given 06/16/23 2112)  gabapentin  (NEURONTIN) capsule 300 mg (300 mg Oral Patient Refused/Not Given 06/16/23 2112)  Vitamin D (Ergocalciferol) (DRISDOL) 1.25 MG (50000 UNIT) capsule 50,000 Units (50,000 Units Oral Given 06/16/23 2138)  albuterol (PROVENTIL) (2.5 MG/3ML) 0.083% nebulizer solution 2.5 mg (has no administration in time range)  loratadine (CLARITIN) tablet 10 mg (10 mg Oral Given 06/16/23 2113)  cefTRIAXone (ROCEPHIN) 2 g in sodium chloride 0.9 % 100 mL IVPB (2 g Intravenous New Bag/Given 06/17/23 0047)  vancomycin (VANCOREADY) IVPB 1250 mg/250 mL (has no administration in time range)  metroNIDAZOLE (FLAGYL) IVPB 500 mg (0 mg Intravenous Stopped 06/16/23 1841)  ceFEPIme (MAXIPIME) 2 g in sodium chloride 0.9 % 100 mL IVPB (0 g Intravenous Stopped 06/16/23 1944)  vancomycin (VANCOCIN) IVPB 1000 mg/200 mL premix (0 mg Intravenous Stopped 06/16/23 2105)    Followed by  vancomycin (VANCOREADY) IVPB 1500 mg/300 mL (1,500 mg Intravenous New Bag/Given 06/16/23 2130)  iohexol (OMNIPAQUE) 350 MG/ML injection 100 mL (100 mLs Intravenous Contrast Given 06/16/23 1718)     IMPRESSION / MDM / ASSESSMENT AND PLAN / ED COURSE  I reviewed the triage vital signs and the nursing notes.                              Differential diagnosis includes, but is not limited to, wound infection, venous stasis, pressure ulceration, cellulitis, abscess, nec fasc  Patient's presentation is most consistent with acute presentation with potential threat to life or bodily function.  The patient is on the cardiac monitor to evaluate for evidence of arrhythmia and/or significant heart rate changes  52 yo F with h/o obesity, lymphedema, here with wound to R thigh. See imaging above. Suspect pressure ulcer with secondary infection. Given difficult exam and extent of wound, CT obtained which fortunately shows no signs of deep abscess or necrotizing infection. She does not appear septic. LA normal. CBC without leukocytosis. CMP unremarkable.  Will admit for IV  ABX, wound care.   FINAL CLINICAL IMPRESSION(S) / ED DIAGNOSES   Final diagnoses:  Open wound of right lower extremity, initial encounter  Cellulitis of right thigh     Rx / DC Orders   ED Discharge Orders     None        Note:  This document was prepared using Dragon voice recognition software and may include unintentional dictation errors.   Shaune Pollack, MD  06/17/23 0128  

## 2023-06-16 NOTE — Assessment & Plan Note (Signed)
Continue Neurontin. 

## 2023-06-16 NOTE — Consult Note (Signed)
PHARMACY -  BRIEF ANTIBIOTIC NOTE   Pharmacy has received consult(s) for cefepime and vancomycin from an ED provider.  The patient's profile has been reviewed for ht/wt/allergies/indication/available labs.    One time order(s) placed for  Cefepime 2 gram Vancomycin 2500 ( 1000 mg + 1500 mg)  Further antibiotics/pharmacy consults should be ordered by admitting physician if indicated.                       Thank you, Sharen Hones, PharmD, BCPS Clinical Pharmacist   06/16/2023  4:36 PM

## 2023-06-16 NOTE — Progress Notes (Signed)
Pharmacy Antibiotic Note  Gabriela Mcgee is a 52 y.o. female admitted on 06/16/2023 with cellulitis.  Pharmacy has been consulted for Vancomycin dosing for 7 days.  Plan: Pt given Vancomycin 2500 mg once. Vancomycin 1250 mg IV Q 12 hrs. Goal AUC 400-550. Calculated AUC 398.7 Expected AUC: > 400 d/t vanc accumulation with BMI over 90. SCr used: 0.65, Vd used: 0.5, BMI: 91.5  Pharmacy will continue to follow and will adjust abx dosing whenever warranted.  Temp (24hrs), Avg:98.4 F (36.9 C), Min:98.2 F (36.8 C), Max:98.6 F (37 C)   Recent Labs  Lab 06/16/23 1549 06/16/23 2041  WBC 6.1  --   CREATININE 0.65  --   LATICACIDVEN 1.1 1.0    Estimated Creatinine Clearance: 138.8 mL/min (by C-G formula based on SCr of 0.65 mg/dL).    Allergies  Allergen Reactions   Lisinopril Swelling   Silicone Dermatitis    Paper and silk tape OK per patient 04/10/2014   Morphine     Very hard to wake up afterwards   Fluconazole Rash   Penicillin G Rash   Tape Dermatitis and Rash    Oozing    Antimicrobials this admission: 10/02 Cefepime >> x 1 10/02 Flagyl >> x 1 10/02 Vancomycin >> x 7 days 10/03 Ceftriaxone >>  Microbiology results: 10/02 BCx: Pending  Thank you for allowing pharmacy to be a part of this patient's care.  Otelia Sergeant, PharmD, [facility] 06/16/2023 9:43 PM

## 2023-06-16 NOTE — H&P (Signed)
[place]   PATIENT NAME: Gabriela Mcgee    MR#:  [id number]  DATE OF BIRTH:  [birth year]  DATE OF ADMISSION:  06/16/2023  PRIMARY CARE PHYSICIAN: [name], DO   Patient is coming from: Home  REQUESTING/REFERRING PHYSICIAN: Shaune Pollack, MD  CHIEF COMPLAINT:   Chief Complaint  Patient presents with   Wound Check    HISTORY OF PRESENT ILLNESS:  Gabriela Mcgee is a 52 y.o. female with medical history significant for essential hypertension OSA on BiPAP, peripheral neuropathy, bilateral lower extremity lymphedema and GERD, who presented to the emergency room with acute onset of right leg wound with drainage that has been fairly persistent over the last 1 to 2 weeks.  Over the last 24 hours the pain has been significantly worse in her drainage has been foul-smelling.  Her home health nurse came today to check on it and sent her to the ER.  She was concerned about an abscess.  The patient has a bariatric bed and chair recently they broke and she has not been unable to take much pressure off of it.  No fever or chills.  No nausea or vomiting or abdominal pain.  No chest pain or palpitations.  No cough or wheezing or hemoptysis.  No dysuria, oliguria or hematuria or flank pain.  ED Course: When she came to the ER vital signs were within normal.  Labs revealed unremarkable CMP.  CBC showed mild anemia with hemoglobin 11.4 hematocrit 34.9 slightly lower than previous levels in July.  Blood cultures were drawn. EKG as reviewed by me : None Imaging: Right femur CT showed the following: 1. Extensive asymmetric soft tissue swelling/edema along the medial aspect of the right thigh, favoring cellulitis/soft tissue infection. No discrete walled-off abscess seen. 2. There is lobulated appearance of uterus and bilateral adnexa, not well evaluated on this exam. Further evaluation with dedicated nonemergent CT scan of the pelvis is recommended.  The patient was given IV vancomycin,  Flagyl and cefepime.  She will be admitted to a medical bed for further evaluation and management. PAST MEDICAL HISTORY:   Past Medical History:  Diagnosis Date   Allergy    Anemia    Blood transfusion without reported diagnosis    Hypertension    Neuropathy    Sleep apnea     PAST SURGICAL HISTORY:  History reviewed. No pertinent surgical history.  SOCIAL HISTORY:   Social History   Tobacco Use   Smoking status: Never   Smokeless tobacco: Never  Substance Use Topics   Alcohol use: Never    FAMILY HISTORY:   Family History  Problem Relation Age of Onset   Heart failure Mother 47 - 57   Breast cancer Neg Hx     DRUG ALLERGIES:   Allergies  Allergen Reactions   Lisinopril Swelling   Silicone Dermatitis    Paper and silk tape OK per patient 04/10/2014   Morphine     Very hard to wake up afterwards   Fluconazole Rash   Penicillin G Rash   Tape Dermatitis and Rash    Oozing    REVIEW OF SYSTEMS:   ROS As per history of present illness. All pertinent systems were reviewed above. Constitutional, HEENT, cardiovascular, respiratory, GI, GU, musculoskeletal, neuro, psychiatric, endocrine, integumentary and hematologic systems were reviewed and are otherwise negative/unremarkable except for positive findings mentioned above in the HPI.   MEDICATIONS AT HOME:   Prior to Admission medications   Medication Sig  Start Date End Date Taking? Authorizing Provider  albuterol (VENTOLIN HFA) 108 (90 Base) MCG/ACT inhaler Inhale 1-2 puffs into the lungs every 6 (six) hours as needed for shortness of breath. 03/18/23   [name], DO  amitriptyline (ELAVIL) 25 MG tablet Take 1 tablet (25 mg total) by mouth at bedtime. 04/29/23   [name], DO  citalopram (CELEXA) 20 MG tablet Take 1 tablet (20 mg total) by mouth daily. 03/18/23   [name], DO  gabapentin (NEURONTIN) 300 MG capsule Take 1 capsule (300 mg total) by mouth 3 (three) times daily. Patient not taking:  Reported on 06/15/2023 03/18/23   [name], DO  ibuprofen (ADVIL) 200 MG tablet Take 600 mg by mouth every 6 (six) hours as needed.    [provider]  loratadine (CLARITIN) 10 MG tablet Take 10 mg by mouth daily.    [provider]  losartan (COZAAR) 25 MG tablet Take 1 tablet by mouth once daily 05/13/23   Pardue, Sarah N, DO  oxybutynin (DITROPAN-XL) 5 MG 24 hr tablet Take 1 tablet (5 mg total) by mouth 2 (two) times daily. 03/31/23   [name], DO  Vitamin D, Ergocalciferol, (DRISDOL) 1.25 MG (50000 UNIT) CAPS capsule Take 1 capsule (50,000 Units total) by mouth every 7 (seven) days. 03/18/23   [name], DO      VITAL SIGNS:  Blood pressure (!) 142/80, pulse 82, temperature 98.2 F (36.8 C), temperature source Oral, resp. rate 20, weight (!) 205.4 kg, SpO2 97%.  PHYSICAL EXAMINATION:  Physical Exam  GENERAL:  52 y.o.-year-old female patient lying in the bed with no acute distress.  EYES: Pupils equal, round, reactive to light and accommodation. No scleral icterus. Extraocular muscles intact.  HEENT: Head atraumatic, normocephalic. Oropharynx and nasopharynx clear.  NECK:  Supple, no jugular venous distention. No thyroid enlargement, no tenderness.  LUNGS: Normal breath sounds bilaterally, no wheezing, rales,rhonchi or crepitation. No use of accessory muscles of respiration.  CARDIOVASCULAR: Regular rate and rhythm, S1, S2 normal. No murmurs, rubs, or gallops.  ABDOMEN: Soft, nondistended, nontender. Bowel sounds present. No organomegaly or mass.  EXTREMITIES: No pedal edema, cyanosis, or clubbing.  NEUROLOGIC: Cranial nerves II through XII are intact. Muscle strength 5/5 in all extremities. Sensation intact. Gait not checked.  PSYCHIATRIC: The patient is alert and oriented x 3.  Normal affect and good eye contact. SKIN: Right posteromedial thigh large wound with erythema, tenderness, yellowish and whitish eschar as well as mild purulent drainage with foul  smell.    LABORATORY PANEL:   CBC Recent Labs  Lab 06/16/23 1549  WBC 6.1  HGB 11.4*  HCT 34.9*  PLT 153   ------------------------------------------------------------------------------------------------------------------  Chemistries  Recent Labs  Lab 06/16/23 1549  NA 138  K 4.0  CL 105  CO2 30  GLUCOSE 103*  BUN 11  CREATININE 0.65  CALCIUM 8.9  AST 12*  ALT 8  ALKPHOS 58  BILITOT 1.0   ------------------------------------------------------------------------------------------------------------------  Cardiac Enzymes No results for input(s): "TROPONINI" in the last 168 hours. ------------------------------------------------------------------------------------------------------------------  RADIOLOGY:  CT FEMUR RIGHT W CONTRAST  Result Date: 06/16/2023 CLINICAL DATA:  Soft tissue infection suspected, thigh, no prior imaging. EXAM: CT OF THE LOWER RIGHT EXTREMITY WITH CONTRAST TECHNIQUE: Multidetector CT imaging of the lower right extremity was performed according to the standard protocol following intravenous contrast administration. RADIATION DOSE REDUCTION: This exam was performed according to the departmental dose-optimization program which includes automated exposure control, adjustment of the  mA and/or kV according to patient size and/or use of iterative reconstruction technique. CONTRAST:  OMNIPAQUE IOHEXOL 350 MG/ML SOLN COMPARISON:  None Available. FINDINGS: Bones/Joint/Cartilage No acute fracture or dislocation. No aggressive osseous lesion. Bilateral hip joints are within normal limits. Ligaments Suboptimally assessed by CT. Muscles and Tendons No focal lesion. Soft tissues There is extensive soft tissue swelling/edema throughout bilateral imaged thighs, with asymmetric involvement of the medial aspect of the right thigh. Multiple subcutaneous vessels are seen traversing through this edematous tissue. However, no discrete walled-off abscess or collection  seen. There is lobulated appearance of uterus and bilateral adnexa, not well evaluated on this exam. Further evaluation with dedicated nonemergent CT scan of the pelvis is recommended. IMPRESSION: 1. Extensive asymmetric soft tissue swelling/edema along the medial aspect of the right thigh, favoring cellulitis/soft tissue infection. No discrete walled-off abscess seen. 2. There is lobulated appearance of uterus and bilateral adnexa, not well evaluated on this exam. Further evaluation with dedicated nonemergent CT scan of the pelvis is recommended. Electronically Signed   By: Jules Schick M.D.   On: 06/16/2023 18:32      IMPRESSION AND PLAN:  Assessment and Plan: * Cellulitis of right thigh - The patient will be admitted to a medical bed. - Will continue antibiotic therapy with IV vancomycin and Rocephin. - General Surgery consult will be obtained for potential pending abscess. - I notified Dr. Maurine Minister about the patient - Wound Gram stain and culture will be obtained. - Will follow blood cultures.  Essential hypertension - We will continue antihypertensive therapy.  Abnormal finding present on diagnostic imaging of uterus Will obtain a pelvic CT scan for further assessment.  Peripheral neuropathy - Continue Neurontin.  Depression - We will continue Lexapro.   DVT prophylaxis: Lovenox.  Advanced Care Planning:  Code Status: full code.  Family Communication:  The plan of care was discussed in details with the patient (and family). I answered all questions. The patient agreed to proceed with the above mentioned plan. Further management will depend upon hospital course. Disposition Plan: Back to previous home environment Consults called: General Surgery. All the records are reviewed and case discussed with ED provider.  Status is: Inpatient  At the time of the admission, it appears that the appropriate admission status for this patient is inpatient.  This is judged to be reasonable  and necessary in order to provide the required intensity of service to ensure the patient's safety given the presenting symptoms, physical exam findings and initial radiographic and laboratory data in the context of comorbid conditions.  The patient requires inpatient status due to high intensity of service, high risk of further deterioration and high frequency of surveillance required.  I certify that at the time of admission, it is my clinical judgment that the patient will require inpatient hospital care extending more than 2 midnights.                            Dispo: The patient is from: Home              Anticipated d/c is to: Home              Patient currently is not medically stable to d/c.              Difficult to place patient: No  Hannah Beat M.D on 06/16/2023 at 9:41 PM  [facility]   From 7 PM-7 AM, contact night-coverage  [email or web address]  CC: Primary care physician; [name], DO

## 2023-06-16 NOTE — ED Triage Notes (Signed)
First Nurse Note;  Pt via [facility] from home. Pt has bilateral foot wound, has been weeping, bleeding, and possibly necrotic for the past 6 months. Pt also has bilateral lymphedema 85 HR  97% on RA 148/98 98.7 oral  123 CBG

## 2023-06-17 DIAGNOSIS — S81801A Unspecified open wound, right lower leg, initial encounter: Secondary | ICD-10-CM

## 2023-06-17 DIAGNOSIS — L03115 Cellulitis of right lower limb: Secondary | ICD-10-CM | POA: Diagnosis not present

## 2023-06-17 DIAGNOSIS — L97119 Non-pressure chronic ulcer of right thigh with unspecified severity: Secondary | ICD-10-CM | POA: Diagnosis not present

## 2023-06-17 LAB — URINALYSIS, W/ REFLEX TO CULTURE (INFECTION SUSPECTED)
Bilirubin Urine: NEGATIVE
Glucose, UA: NEGATIVE mg/dL
Ketones, ur: NEGATIVE mg/dL
Nitrite: POSITIVE — AB
Protein, ur: NEGATIVE mg/dL
Specific Gravity, Urine: 1.019 (ref 1.005–1.030)
pH: 7 (ref 5.0–8.0)

## 2023-06-17 LAB — BASIC METABOLIC PANEL
Anion gap: 8 (ref 5–15)
BUN: 9 mg/dL (ref 6–20)
CO2: 26 mmol/L (ref 22–32)
Calcium: 8.4 mg/dL — ABNORMAL LOW (ref 8.9–10.3)
Chloride: 104 mmol/L (ref 98–111)
Creatinine, Ser: 0.53 mg/dL (ref 0.44–1.00)
GFR, Estimated: >60 mL/min (ref >60)
Glucose, Bld: 100 mg/dL — ABNORMAL HIGH (ref 70–99)
Potassium: 3.5 mmol/L (ref 3.5–5.1)
Sodium: 138 mmol/L (ref 135–145)

## 2023-06-17 LAB — CBC
HCT: 30.9 % — ABNORMAL LOW (ref 36.0–46.0)
Hemoglobin: 10.1 g/dL — ABNORMAL LOW (ref 12.0–15.0)
MCH: 24.3 pg — ABNORMAL LOW (ref 26.0–34.0)
MCHC: 32.7 g/dL (ref 30.0–36.0)
MCV: 74.3 fL — ABNORMAL LOW (ref 80.0–100.0)
Platelets: 175 10*3/uL (ref 150–400)
RBC: 4.16 MIL/uL (ref 3.87–5.11)
RDW: 15 % (ref 11.5–15.5)
WBC: 5.7 10*3/uL (ref 4.0–10.5)
nRBC: 0 % (ref 0.0–0.2)

## 2023-06-17 LAB — HEMOGLOBIN A1C
Hgb A1c MFr Bld: 5.9 % — ABNORMAL HIGH (ref 4.8–5.6)
Mean Plasma Glucose: 122.63 mg/dL

## 2023-06-17 MED ORDER — INFLUENZA VIRUS VACC SPLIT PF (FLUZONE) 0.5 ML IM SUSY: 0.5000 mL | PREFILLED_SYRINGE | Freq: Once | INTRAMUSCULAR | Status: DC

## 2023-06-17 MED ORDER — AMITRIPTYLINE HCL 25 MG PO TABS
25.0000 mg | ORAL_TABLET | Freq: Every day | ORAL | Status: DC
  Administered 2023-06-17 – 2023-06-20 (×4): 25 mg via ORAL
  Filled 2023-06-17 (×4): qty 1

## 2023-06-17 MED ORDER — INFLUENZA VIRUS VACC SPLIT PF (FLUZONE) 0.5 ML IM SUSY
0.5000 mL | PREFILLED_SYRINGE | Freq: Once | INTRAMUSCULAR | Status: AC
  Administered 2023-06-21: 0.5 mL via INTRAMUSCULAR

## 2023-06-17 MED ORDER — MEDIHONEY WOUND/BURN DRESSING EX PSTE
1.0000 | PASTE | Freq: Every day | CUTANEOUS | Status: DC
  Administered 2023-06-17 – 2023-06-21 (×5): 1 via TOPICAL
  Filled 2023-06-17 (×2): qty 44

## 2023-06-17 MED ORDER — ORAL CARE MOUTH RINSE: 15.0000 mL | OROMUCOSAL | Status: DC | PRN

## 2023-06-17 NOTE — Progress Notes (Signed)
Patient has cellulitis, Peripheral neuropathy, open wound right lower extremity, venous stasis ulcer, lymphedema bilateral lower extremity Chronic fatigue,  which requires whole body to be positioned in ways not feasible with a normal bed. cellulitis, Peripheral neuropathy, open wound right lower extremity, venous stasis ulcer, lymphedema bilateral lower extremity Chronic fatigue. Frequently upon awakening requires frequent changes in body position which cannot be achieved with a normal bed.

## 2023-06-17 NOTE — Plan of Care (Signed)

## 2023-06-17 NOTE — Consult Note (Signed)
[facility] SURGICAL CONSULTATION NOTE (initial) - cpt: 82956   HISTORY OF PRESENT ILLNESS (HPI):  52 y.o. female presented to [facility] ED yesterday for evaluation of right leg wound. Patient with a longstanding history of lymphedema. Presents to the ED yesterday secondary to concerns over drainage from a wound to her right leg/pannus. She reports that she was getting care from her [facility] RN and noticed increased drainage from this wound. Reportedly foul smelling. Given this, her [facility] RN recommended evaluation in the ED. She denied any fever, chills. She is unsure how long she has had this wound. Work up in the ED revealed a normal WBC at 6.1K, Hgb to 11.4, sCr - 0.65, venous lactate normal x2, and blood cultures are without growth. She did have CT Right Femur which showed diffuse soft tissue edema, no gross abscess. She was admitted to the medicine service. She is currently on Rocephin and Vancomycin.    Surgery is consulted by hospitalist physician Dr. Valente David, MD in this context for evaluation and management of right thigh/pannus wound.  PAST MEDICAL HISTORY (PMH):  Past Medical History:  Diagnosis Date   Allergy    Anemia    Blood transfusion without reported diagnosis    Hypertension    Neuropathy    Sleep apnea      PAST SURGICAL HISTORY (PSH):  History reviewed. No pertinent surgical history.   MEDICATIONS:  Prior to Admission medications   Medication Sig Start Date End Date Taking? Authorizing Provider  albuterol (VENTOLIN HFA) 108 (90 Base) MCG/ACT inhaler Inhale 1-2 puffs into the lungs every 6 (six) hours as needed for shortness of breath. 03/18/23  Yes Pardue, Monico Blitz, DO  amitriptyline (ELAVIL) 25 MG tablet Take 1 tablet (25 mg total) by mouth at bedtime. 04/29/23  Yes Pardue, Monico Blitz, DO  citalopram (CELEXA) 20 MG tablet Take 1 tablet (20 mg total) by mouth daily. 03/18/23  Yes Pardue, Monico Blitz, DO  ibuprofen (ADVIL) 200 MG tablet Take 600 mg by mouth every 6 (six) hours  as needed.   Yes [provider]  loratadine (CLARITIN) 10 MG tablet Take 10 mg by mouth daily.   Yes [provider]  losartan (COZAAR) 25 MG tablet Take 1 tablet by mouth once daily 05/13/23  Yes Pardue, Sarah N, DO  oxybutynin (DITROPAN-XL) 5 MG 24 hr tablet Take 1 tablet (5 mg total) by mouth 2 (two) times daily. 03/31/23  Yes Pardue, Monico Blitz, DO  Vitamin D, Ergocalciferol, (DRISDOL) 1.25 MG (50000 UNIT) CAPS capsule Take 1 capsule (50,000 Units total) by mouth every 7 (seven) days. 03/18/23  Yes Pardue, Monico Blitz, DO  gabapentin (NEURONTIN) 300 MG capsule Take 1 capsule (300 mg total) by mouth 3 (three) times daily. Patient not taking: Reported on 06/15/2023 03/18/23   [name], DO     ALLERGIES:  Allergies  Allergen Reactions   Lisinopril Swelling   Silicone Dermatitis    Paper and silk tape OK per patient 04/10/2014   Morphine     Very hard to wake up afterwards   Fluconazole Rash   Penicillin G Rash   Tape Dermatitis and Rash    Oozing     SOCIAL HISTORY:  Social History   Socioeconomic History   Marital status: Single    Spouse name: Not on file   Number of children: Not on file   Years of education: Not on file   Highest education level: Not on file  Occupational History  Not on file  Tobacco Use   Smoking status: Never   Smokeless tobacco: Never  Vaping Use   Vaping status: Never Used  Substance and Sexual Activity   Alcohol use: Never   Drug use: Never   Sexual activity: Not on file  Other Topics Concern   Not on file  Social History Narrative   Not on file   Social Determinants of Health   Financial Resource Strain: Not on file  Food Insecurity: No Food Insecurity (06/16/2023)   Hunger Vital Sign    Worried About Running Out of Food in the Last Year: Never true    Ran Out of Food in the Last Year: Never true  Transportation Needs: No Transportation Needs (06/16/2023)   PRAPARE - Administrator, Civil Service (Medical): No     Lack of Transportation (Non-Medical): No  Physical Activity: Not on file  Stress: Not on file  Social Connections: Not on file  Intimate Partner Violence: Not At Risk (06/16/2023)   Humiliation, Afraid, Rape, and Kick questionnaire    Fear of Current or Ex-Partner: No    Emotionally Abused: No    Physically Abused: No    Sexually Abused: No     FAMILY HISTORY:  Family History  Problem Relation Age of Onset   Heart failure Mother 12 - 63   Breast cancer Neg Hx       REVIEW OF SYSTEMS:  Review of Systems  Constitutional:  Negative for chills and fever.  Respiratory:  Negative for cough and shortness of breath.   Cardiovascular:  Negative for chest pain and palpitations.  Gastrointestinal:  Negative for abdominal pain, nausea and vomiting.  Skin:  Negative for itching and rash.       + Right thigh/pannus wound   All other systems reviewed and are negative.   VITAL SIGNS:  Temp:  [98.2 F (36.8 C)-98.6 F (37 C)] 98.2 F (36.8 C) (10/02 2032) Pulse Rate:  [66-86] 82 (10/02 2032) Resp:  [14-22] 20 (10/02 2032) BP: (117-142)/(53-80) 142/80 (10/02 2032) SpO2:  [97 %-100 %] 97 % (10/02 2032) FiO2 (%):  [21 %] 21 % (10/03 0131) Weight:  [205.4 kg] 205.4 kg (10/02 1515)       Weight: (!) 205.4 kg     INTAKE/OUTPUT:  10/02 0701 - 10/03 0700 In: 735.4 [I.V.:235.3; IV Piggyback:500.1] Out: 850 [Urine:850]  PHYSICAL EXAM:  Physical Exam Vitals and nursing note reviewed. Exam conducted with a chaperone present.  Constitutional:      Appearance: Normal appearance. She is obese.     Comments: Patient resting in bed; NAD. RN at bedside to assist  HENT:     Head: Normocephalic and atraumatic.  Eyes:     General: No scleral icterus.    Conjunctiva/sclera: Conjunctivae normal.  Pulmonary:     Effort: Pulmonary effort is normal. No respiratory distress.  Genitourinary:    Comments: Deferred Musculoskeletal:     Comments: Significant edema to bilateral lower extremities  consistent with known history of lymphedema   Skin:    General: Skin is warm and dry.          Comments: To the right medial thigh and overlaying pannus, there is a stage 1 pressure injury, no evidence of infection, no evidence of abscess, no evidence of necrosis.   Neurological:     General: No focal deficit present.     Mental Status: She is alert and oriented to person, place, and time.  Psychiatric:  Mood and Affect: Mood normal.        Behavior: Behavior normal.      Labs:     Latest Ref Rng & Units 06/17/2023    3:51 AM 06/16/2023    3:49 PM 03/17/2023    4:21 PM  CBC  WBC 4.0 - 10.5 K/uL 5.7  6.1  4.1   Hemoglobin 12.0 - 15.0 g/dL 09.8  11.9  14.7   Hematocrit 36.0 - 46.0 % 30.9  34.9  40.1   Platelets 150 - 400 K/uL 175  153        Latest Ref Rng & Units 06/17/2023    3:51 AM 06/16/2023    3:49 PM 03/17/2023    4:21 PM  CMP  Glucose 70 - 99 mg/dL 829  562  91   BUN 6 - 20 mg/dL 9  11  11    Creatinine 0.44 - 1.00 mg/dL 1.30  8.65  7.84   Sodium 135 - 145 mmol/L 138  138  141   Potassium 3.5 - 5.1 mmol/L 3.5  4.0  4.3   Chloride 98 - 111 mmol/L 104  105  103   CO2 22 - 32 mmol/L 26  30  25    Calcium 8.9 - 10.3 mg/dL 8.4  8.9  9.4   Total Protein 6.5 - 8.1 g/dL  7.8  7.8   Total Bilirubin 0.3 - 1.2 mg/dL  1.0  1.1   Alkaline Phos 38 - 126 U/L  58  77   AST 15 - 41 U/L  12  8   ALT 0 - 44 U/L  8  6      Imaging studies:   CT Right Femur (06/16/2023) personally reviewed, noted soft tissue changes, no gross abscess, no subcutaneous emphysema, and radiologist report reviewed below:  IMPRESSION: 1. Extensive asymmetric soft tissue swelling/edema along the medial aspect of the right thigh, favoring cellulitis/soft tissue infection. No discrete walled-off abscess seen. 2. There is lobulated appearance of uterus and bilateral adnexa, not well evaluated on this exam. Further evaluation with dedicated nonemergent CT scan of the pelvis is  recommended.   Assessment/Plan:  52 y.o. female with stage 1 pressure injury to the right medial thigh and overlaying pannus, no evidence of infection/abscess, complicated by pertinent comorbidities significant lymphedema, body habitus.   - Fortunately, this area of concern over the right medial thigh and overlaying pannus appears to be stage 1 pressure injury. There is no evidence of infection, necrosis, nor abscess. I do not think this needs any surgical intervention nor debridement. I have discussed wound with WOC as well. This was cleansed with Vashe and we agreed upon Medihoney and Mediplex dressings. Would make efforts at alleviating pressure in this area as much as feasible. Nothing further from surgical perspective; we will remain available as needed   All of the above findings and recommendations were discussed with the patient, and all of patient's questions were answered to her expressed satisfaction.  Thank you for the opportunity to participate in this patient's care.   -- Lynden Oxford, PA-C [facility] 06/17/2023, 7:05 AM M-F: 7am - 4pm

## 2023-06-17 NOTE — Progress Notes (Signed)
PROGRESS NOTE    Gabriela Mcgee  ZOX:[id number] DOB: 05/13/1971 DOA: 06/16/2023 PCP: [name], DO   Assessment & Plan:   Principal Problem:   Cellulitis of right thigh Active Problems:   Essential hypertension   Peripheral neuropathy   Abnormal finding present on diagnostic imaging of uterus   Depression  Assessment and Plan: Cellulitis of right thigh: continue on IV rocephin, vanco. Blood cxs are pending. Urine cx is pending. No debridement needed currently as per gen surg. Gen surg recs apprec   HTN: holding home dose of losartan    Lobulated uterus: nonemergent CT scan outpatient can be done    Peripheral neuropathy: continue on gabapentin    Depression: severity unknown. Continue on home dose of citalopram   Morbid obesity: BMI 91.4. Complicates overall care & prognosis.   DVT prophylaxis: lovenox  Code Status: full  Family Communication:  Disposition Plan: likely d/c back home   Level of care: Med-Surg Status is: Inpatient Remains inpatient appropriate because: severity of illness    Consultants:  Gen surg   Procedures:   Antimicrobials: rocephin, vanco    Subjective: Pt c/o malaise   Objective: Vitals:   06/16/23 1945 06/16/23 2000 06/16/23 2011 06/16/23 2032  BP:  (!) 129/53  (!) 142/80  Pulse: 75   82  Resp: (!) 22   20  Temp:   98.5 F (36.9 C) 98.2 F (36.8 C)  TempSrc:   Oral Oral  SpO2: 100%   97%  Weight:        Intake/Output Summary (Last 24 hours) at 06/17/2023 0824 Last data filed at 06/17/2023 0724 Gross per 24 hour  Intake 1124.61 ml  Output 850 ml  Net 274.61 ml   Filed Weights   06/16/23 1515  Weight: (!) 205.4 kg    Examination:  General exam: Appears calm but uncomfortable. Morbidly obese Respiratory system: diminished breath sounds b/l  Cardiovascular system: S1 & S2+. No rubs, gallops or clicks.  Gastrointestinal system: Abdomen is obese, soft and nontender.  Hypoactive bowel sounds heard. Central  nervous system: Alert and oriented.  Psychiatry: Judgement and insight appear normal. Mood & affect appropriate.     Data Reviewed: I have personally reviewed following labs and imaging studies  CBC: Recent Labs  Lab 06/16/23 1549 06/17/23 0351  WBC 6.1 5.7  NEUTROABS 4.0  --   HGB 11.4* 10.1*  HCT 34.9* 30.9*  MCV 75.2* 74.3*  PLT 153 175   Basic Metabolic Panel: Recent Labs  Lab 06/16/23 1549 06/17/23 0351  NA 138 138  K 4.0 3.5  CL 105 104  CO2 30 26  GLUCOSE 103* 100*  BUN 11 9  CREATININE 0.65 0.53  CALCIUM 8.9 8.4*   GFR: Estimated Creatinine Clearance: 138.8 mL/min (by C-G formula based on SCr of 0.53 mg/dL). Liver Function Tests: Recent Labs  Lab 06/16/23 1549  AST 12*  ALT 8  ALKPHOS 58  BILITOT 1.0  PROT 7.8  ALBUMIN 3.5   No results for input(s): "LIPASE", "AMYLASE" in the last 168 hours. No results for input(s): "AMMONIA" in the last 168 hours. Coagulation Profile: No results for input(s): "INR", "PROTIME" in the last 168 hours. Cardiac Enzymes: No results for input(s): "CKTOTAL", "CKMB", "CKMBINDEX", "TROPONINI" in the last 168 hours. BNP (last 3 results) No results for input(s): "PROBNP" in the last 8760 hours. HbA1C: No results for input(s): "HGBA1C" in the last 72 hours. CBG: No results for input(s): "GLUCAP" in the last 168 hours. Lipid Profile:  No results for input(s): "CHOL", "HDL", "LDLCALC", "TRIG", "CHOLHDL", "LDLDIRECT" in the last 72 hours. Thyroid Function Tests: No results for input(s): "TSH", "T4TOTAL", "FREET4", "T3FREE", "THYROIDAB" in the last 72 hours. Anemia Panel: No results for input(s): "VITAMINB12", "FOLATE", "FERRITIN", "TIBC", "IRON", "RETICCTPCT" in the last 72 hours. Sepsis Labs: Recent Labs  Lab 06/16/23 1549 06/16/23 2041  LATICACIDVEN 1.1 1.0    No results found for this or any previous visit (from the past 240 hour(s)).       Radiology Studies: CT FEMUR RIGHT W CONTRAST  Result Date:  06/16/2023 CLINICAL DATA:  Soft tissue infection suspected, thigh, no prior imaging. EXAM: CT OF THE LOWER RIGHT EXTREMITY WITH CONTRAST TECHNIQUE: Multidetector CT imaging of the lower right extremity was performed according to the standard protocol following intravenous contrast administration. RADIATION DOSE REDUCTION: This exam was performed according to the departmental dose-optimization program which includes automated exposure control, adjustment of the mA and/or kV according to patient size and/or use of iterative reconstruction technique. CONTRAST:  OMNIPAQUE IOHEXOL 350 MG/ML SOLN COMPARISON:  None Available. FINDINGS: Bones/Joint/Cartilage No acute fracture or dislocation. No aggressive osseous lesion. Bilateral hip joints are within normal limits. Ligaments Suboptimally assessed by CT. Muscles and Tendons No focal lesion. Soft tissues There is extensive soft tissue swelling/edema throughout bilateral imaged thighs, with asymmetric involvement of the medial aspect of the right thigh. Multiple subcutaneous vessels are seen traversing through this edematous tissue. However, no discrete walled-off abscess or collection seen. There is lobulated appearance of uterus and bilateral adnexa, not well evaluated on this exam. Further evaluation with dedicated nonemergent CT scan of the pelvis is recommended. IMPRESSION: 1. Extensive asymmetric soft tissue swelling/edema along the medial aspect of the right thigh, favoring cellulitis/soft tissue infection. No discrete walled-off abscess seen. 2. There is lobulated appearance of uterus and bilateral adnexa, not well evaluated on this exam. Further evaluation with dedicated nonemergent CT scan of the pelvis is recommended. Electronically Signed   By: Jules Schick M.D.   On: 06/16/2023 18:32        Scheduled Meds:  citalopram  20 mg Oral Daily   enoxaparin (LOVENOX) injection  0.5 mg/kg Subcutaneous Q24H   gabapentin  300 mg Oral TID   [START ON  06/20/2023] influenza vac split trivalent PF  0.5 mL Intramuscular Once   loratadine  10 mg Oral Daily   oxybutynin  5 mg Oral BID   Vitamin D (Ergocalciferol)  50,000 Units Oral Q7 days   Continuous Infusions:  sodium chloride 100 mL/hr at 06/17/23 0724   cefTRIAXone (ROCEPHIN)  IV 2 g (06/17/23 0047)   vancomycin       LOS: 1 day      Charise Killian, MD [facility] Pager [phone]  If 7PM-7AM, please contact night-coverage [email or web address] 06/17/2023, 8:24 AM

## 2023-06-17 NOTE — Consult Note (Addendum)
WOC Nurse Consult Note: this patient has also been evaluated by surgery for ? Abscess R posterior medial thigh; patient with longstanding lymphedema  Reason for Consult: R posterior thigh wound  Wound type: full thickness ? R/t pressure/lymphedema  Pressure Injury POA: NA  Measurement: approximately 11 cm x 9 cm  Wound bed: 50% red moist 25% yellow 25% black eschar  Drainage (amount, consistency, odor) moderate foul smelling exudate  Periwound: skin changes consistent with lymphedema  Dressing procedure/placement/frequency: Clean R posterior medial thigh full thickness wound with Vashe wound cleanser Hart [place] [phone]), apply Medihoney to wound bed daily, cover with saline moist gauze, dry gauze and ABD pad.  May tape ABD pad  to secure if needed.    POC discussed with patient, bedside nurse and surgical PA.    WOC team will not follow. Re-consult if further needs arise.   Thank you,    Priscella Mann MSN, RN-BC, [employer] [phone]

## 2023-06-17 NOTE — Progress Notes (Signed)
Will Need bariatric Wheel Chair. Patient suffers from Lymphedema bilaterally, Cellulitis, which impairs their ability to perform daily activities like ADLs  in the home.  A Walking aide will not resolve issue with performing activities of daily living. A wheelchair will allow patient to safely perform daily activities. Patient can safely propel the wheelchair in the home or has a caregiver who can provide assistance. Length of need 12 months. Accessories: elevating leg rests (ELRs), wheel locks, extensions and anti-tippers.

## 2023-06-17 NOTE — TOC Initial Note (Signed)
Transition of Care [facility]) - Initial/Assessment Note    Patient Details  Name: Gabriela Mcgee MRN: [id number] Date of Birth: April 09, 1971  Transition of Care [facility]) CM/SW Contact:    Marlowe Sax, RN Phone Number: 06/17/2023, 3:48 PM  Clinical Narrative:                  Met with the patient in the room, She stated that she lives at home with her brother and sisiter She is open with [facility] and that was confirmed by Merry Proud at [facility], she will continue with their services She needs a Bariatric WC and a bariatric Bed with a trapeze and low air flow mattress for wounds, Spoke to [place] at [employer], they will deliver the wheelchair to the room and the bed to her home She uses Medical transport thru her Ins to get to Dr appointment She will Google to find a ramp company to rent a ramp with Follow up on how she will transport home may need EMS or Safe transport  Expected Discharge Plan: Home w Home Health Services Barriers to Discharge: No Barriers Identified   Patient Goals and CMS Choice            Expected Discharge Plan and Services   Discharge Planning Services: CM Consult   Living arrangements for the past 2 months: Single Family Home                 DME Arranged: Wheelchair manual, Hospital bed, Trapeze DME Agency: [employer] Date DME Agency Contacted: 06/17/23 Time DME Agency Contacted: [phone] Representative spoke with at DME Agency: Mitch HH Arranged: PT, OT, RN HH Agency: [facility] Date [facility] Agency Contacted: 06/17/23 Time HH Agency Contacted: [phone] Representative spoke with at [facility] Agency: Brandi  Prior Living Arrangements/Services Living arrangements for the past 2 months: Single Family Home Lives with:: Relatives Patient language and need for interpreter reviewed:: Yes Do you feel safe going back to the place where you live?: Yes      Need for Family Participation in Patient Care: Yes (Comment) Care giver support system in place?: Yes  (comment) Current home services: DME (rolling walker) Criminal Activity/Legal Involvement Pertinent to Current Situation/Hospitalization: No - Comment as needed  Activities of Daily Living   ADL Screening (condition at time of admission) Independently performs ADLs?: Yes (appropriate for developmental age) Is the patient deaf or have difficulty hearing?: No Does the patient have difficulty seeing, even when wearing glasses/contacts?: No Does the patient have difficulty concentrating, remembering, or making decisions?: No  Permission Sought/Granted   Permission granted to share information with : Yes, Verbal Permission Granted              Emotional Assessment Appearance:: Appears stated age Attitude/Demeanor/Rapport: Engaged, Gracious Affect (typically observed): Accepting, Pleasant Orientation: : Oriented to Self, Oriented to Place, Oriented to  Time, Oriented to Situation Alcohol / Substance Use: Not Applicable Psych Involvement: No (comment)  Admission diagnosis:  Cellulitis of right thigh [L03.115] Patient Active Problem List   Diagnosis Date Noted   Open wound of right lower extremity 06/17/2023   Cellulitis of right thigh 06/16/2023   Essential hypertension 06/16/2023   Peripheral neuropathy 06/16/2023   Depression 06/16/2023   Abnormal finding present on diagnostic imaging of uterus 06/16/2023   Venous stasis ulcer of left lower extremity (HCC) 03/25/2023   Urinary incontinence 03/25/2023   Peripheral neuropathic pain 03/25/2023   Primary hypertension 03/25/2023   Depression, recurrent (HCC) 03/25/2023   Lymphedema of  both lower extremities 03/25/2023   Difficulty maintaining body in lying position 03/25/2023   Chronic fatigue 03/25/2023   Annual physical exam 03/25/2023   Morbid obesity with BMI of 60.0-69.9, adult (HCC) 03/17/2023   Sleep apnea treated with nocturnal bilevel positive airway pressure (BPAP) 03/17/2023   Increased weakness when ambulating  03/17/2023   GERD (gastroesophageal reflux disease) 04/10/2014   History of DVT (deep vein thrombosis) 04/10/2014   PCP:  [name], DO Pharmacy:   [facility] [address], [place] - [address] [place] [address] Phone: [phone] Fax: [phone]     Social Determinants of Health (SDOH) Social History: SDOH Screenings   Food Insecurity: No Food Insecurity (06/16/2023)  Housing: Patient Unable To Answer (06/16/2023)  Transportation Needs: No Transportation Needs (06/16/2023)  Utilities: Not At Risk (06/16/2023)  Depression (PHQ2-9): Medium Risk (03/31/2023)  Tobacco Use: Low Risk  (06/16/2023)   SDOH Interventions:     Readmission Risk Interventions     No data to display

## 2023-06-18 DIAGNOSIS — L03115 Cellulitis of right lower limb: Secondary | ICD-10-CM | POA: Diagnosis not present

## 2023-06-18 DIAGNOSIS — G4733 Obstructive sleep apnea (adult) (pediatric): Secondary | ICD-10-CM | POA: Diagnosis not present

## 2023-06-18 LAB — URINE CULTURE: Culture: <10000 — AB

## 2023-06-18 MED ORDER — DOXYCYCLINE HYCLATE 100 MG PO TABS
100.0000 mg | ORAL_TABLET | Freq: Two times a day (BID) | ORAL | Status: DC
  Administered 2023-06-18 – 2023-06-21 (×7): 100 mg via ORAL
  Filled 2023-06-18 (×7): qty 1

## 2023-06-18 NOTE — Progress Notes (Signed)
PROGRESS NOTE    Gabriela Mcgee  RUE:[id number] DOB: 08-27-1971 DOA: 06/16/2023 PCP: Gabriela Hay, DO   Assessment & Plan:   Principal Problem:   Cellulitis of right thigh Active Problems:   Essential hypertension   Peripheral neuropathy   Abnormal finding present on diagnostic imaging of uterus   Depression   Open wound of right lower extremity  Assessment and Plan: Cellulitis of right thigh: continue on IV rocephin, d/c IV vanco and start po doxy. Blood cxs NGTD. Urine cx shows insignificant growth. No debridement needed currently as per gen surg. Gen surg recs apprec   HTN: continue to hold losartan    Lobulated uterus: nonemergent CT scan outpatient can be done    Peripheral neuropathy: no longer taking gabapentin. Continue on home dose of amitriptyline    Depression: severity unknown. Continue on home dose of citalopram   Morbid obesity: BMI 91.4. Complicates overall care & prognosis    DVT prophylaxis: lovenox  Code Status: full  Family Communication:  Disposition Plan: likely d/c back home   Level of care: Med-Surg Status is: Inpatient Remains inpatient appropriate because: severity of illness    Consultants:  Gen surg   Procedures:   Antimicrobials: rocephin, vanco    Subjective: Pt c/o malaise   Objective: Vitals:   06/16/23 2032 06/17/23 0917 06/17/23 1552 06/18/23 0106  BP: (!) 142/80 (!) 111/53 112/73 126/66  Pulse: 82 82 81 62  Resp: 20 17 17 18   Temp: 98.2 F (36.8 C) 98 F (36.7 C) 98.3 F (36.8 C) 98 F (36.7 C)  TempSrc: Oral Oral    SpO2: 97% 100% 96% 98%  Weight:        Intake/Output Summary (Last 24 hours) at 06/18/2023 0848 Last data filed at 06/18/2023 0417 Gross per 24 hour  Intake 1213.59 ml  Output 450 ml  Net 763.59 ml   Filed Weights   06/16/23 1515  Weight: (!) 205.4 kg    Examination:  General exam: Appears comfortable. Morbidly obese Respiratory system: decreased breath sounds b/l  Cardiovascular  system: S1/S2+. No rubs or clicks   Gastrointestinal system: abd is soft, NT, obese & hypoactive bowel sounds  Central nervous system: alert & oriented. Moves all extremities  Psychiatry: judgement and insight appears normal. Flat mood and affect     Data Reviewed: I have personally reviewed following labs and imaging studies  CBC: Recent Labs  Lab 06/16/23 1549 06/17/23 0351  WBC 6.1 5.7  NEUTROABS 4.0  --   HGB 11.4* 10.1*  HCT 34.9* 30.9*  MCV 75.2* 74.3*  PLT 153 175   Basic Metabolic Panel: Recent Labs  Lab 06/16/23 1549 06/17/23 0351  NA 138 138  K 4.0 3.5  CL 105 104  CO2 30 26  GLUCOSE 103* 100*  BUN 11 9  CREATININE 0.65 0.53  CALCIUM 8.9 8.4*   GFR: Estimated Creatinine Clearance: 138.8 mL/min (by C-G formula based on SCr of 0.53 mg/dL). Liver Function Tests: Recent Labs  Lab 06/16/23 1549  AST 12*  ALT 8  ALKPHOS 58  BILITOT 1.0  PROT 7.8  ALBUMIN 3.5   No results for input(s): "LIPASE", "AMYLASE" in the last 168 hours. No results for input(s): "AMMONIA" in the last 168 hours. Coagulation Profile: No results for input(s): "INR", "PROTIME" in the last 168 hours. Cardiac Enzymes: No results for input(s): "CKTOTAL", "CKMB", "CKMBINDEX", "TROPONINI" in the last 168 hours. BNP (last 3 results) No results for input(s): "PROBNP" in the last 8760 hours.  HbA1C: Recent Labs    06/17/23 0351  HGBA1C 5.9*   CBG: No results for input(s): "GLUCAP" in the last 168 hours. Lipid Profile: No results for input(s): "CHOL", "HDL", "LDLCALC", "TRIG", "CHOLHDL", "LDLDIRECT" in the last 72 hours. Thyroid Function Tests: No results for input(s): "TSH", "T4TOTAL", "FREET4", "T3FREE", "THYROIDAB" in the last 72 hours. Anemia Panel: No results for input(s): "VITAMINB12", "FOLATE", "FERRITIN", "TIBC", "IRON", "RETICCTPCT" in the last 72 hours. Sepsis Labs: Recent Labs  Lab 06/16/23 1549 06/16/23 2041  LATICACIDVEN 1.1 1.0    Recent Results (from the past  240 hour(s))  Blood culture (routine x 2)     Status: None (Preliminary result)   Collection Time: 06/16/23  4:55 PM   Specimen: BLOOD  Result Value Ref Range Status   Specimen Description BLOOD LEFT ANTECUBITAL  Final   Special Requests   Final    BOTTLES DRAWN AEROBIC AND ANAEROBIC Blood Culture adequate volume   Culture   Final    NO GROWTH 2 DAYS Performed at [facility], [address], [place], [place] [address]    Report Status PENDING  Incomplete  Blood culture (routine x 2)     Status: None (Preliminary result)   Collection Time: 06/16/23  5:04 PM   Specimen: BLOOD  Result Value Ref Range Status   Specimen Description BLOOD RIGHT ANTECUBITAL  Final   Special Requests   Final    BOTTLES DRAWN AEROBIC AND ANAEROBIC Blood Culture adequate volume   Culture   Final    NO GROWTH 2 DAYS Performed at [facility], [address] [place], [place] [address]    Report Status PENDING  Incomplete         Radiology Studies: CT FEMUR RIGHT W CONTRAST  Result Date: 06/16/2023 CLINICAL DATA:  Soft tissue infection suspected, thigh, no prior imaging. EXAM: CT OF THE LOWER RIGHT EXTREMITY WITH CONTRAST TECHNIQUE: Multidetector CT imaging of the lower right extremity was performed according to the standard protocol following intravenous contrast administration. RADIATION DOSE REDUCTION: This exam was performed according to the departmental dose-optimization program which includes automated exposure control, adjustment of the mA and/or kV according to patient size and/or use of iterative reconstruction technique. CONTRAST:  OMNIPAQUE IOHEXOL 350 MG/ML SOLN COMPARISON:  None Available. FINDINGS: Bones/Joint/Cartilage No acute fracture or dislocation. No aggressive osseous lesion. Bilateral hip joints are within normal limits. Ligaments Suboptimally assessed by CT. Muscles and Tendons No focal lesion. Soft tissues There is extensive soft tissue swelling/edema  throughout bilateral imaged thighs, with asymmetric involvement of the medial aspect of the right thigh. Multiple subcutaneous vessels are seen traversing through this edematous tissue. However, no discrete walled-off abscess or collection seen. There is lobulated appearance of uterus and bilateral adnexa, not well evaluated on this exam. Further evaluation with dedicated nonemergent CT scan of the pelvis is recommended. IMPRESSION: 1. Extensive asymmetric soft tissue swelling/edema along the medial aspect of the right thigh, favoring cellulitis/soft tissue infection. No discrete walled-off abscess seen. 2. There is lobulated appearance of uterus and bilateral adnexa, not well evaluated on this exam. Further evaluation with dedicated nonemergent CT scan of the pelvis is recommended. Electronically Signed   By: Jules Schick M.D.   On: 06/16/2023 18:32        Scheduled Meds:  amitriptyline  25 mg Oral QHS   citalopram  20 mg Oral Daily   enoxaparin (LOVENOX) injection  0.5 mg/kg Subcutaneous Q24H   gabapentin  300 mg Oral TID   [START  ON 06/20/2023] influenza vac split trivalent PF  0.5 mL Intramuscular Once   leptospermum manuka honey  1 Application Topical Daily   loratadine  10 mg Oral Daily   oxybutynin  5 mg Oral BID   Vitamin D (Ergocalciferol)  50,000 Units Oral Q7 days   Continuous Infusions:  sodium chloride 100 mL/hr at 06/17/23 0920   cefTRIAXone (ROCEPHIN)  IV Stopped (06/18/23 0128)   vancomycin Stopped (06/18/23 0028)     LOS: 2 days      Charise Killian, MD [facility] Pager [phone]  If 7PM-7AM, please contact night-coverage [email or web address] 06/18/2023, 8:48 AM

## 2023-06-18 NOTE — TOC Progression Note (Signed)
Transition of Care [facility]) - Progression Note    Patient Details  Name: AMARIONNA ARCA MRN: [id number] Date of Birth: July 30, 1971  [facility] CM/SW Contact  Marlowe Sax, RN Phone Number: 06/18/2023, 2:42 PM  Clinical Narrative:    Marthann Schiller with [employer] verified that the Hospital bed was delivered at 1 PM to the home   Expected Discharge Plan: Home w Home Health Services Barriers to Discharge: No Barriers Identified  Expected Discharge Plan and Services   Discharge Planning Services: CM Consult   Living arrangements for the past 2 months: Single Family Home                 DME Arranged: Wheelchair manual, Hospital bed, Trapeze DME Agency: [facility] Date DME Agency Contacted: 06/17/23 Time DME Agency Contacted: [phone] Representative spoke with at DME Agency: Mitch HH Arranged: PT, OT, RN HH Agency: [facility] Date [facility] Agency Contacted: 06/17/23 Time HH Agency Contacted: 1548 Representative spoke with at [facility] Agency: Merry Proud   Social Determinants of Health (SDOH) Interventions SDOH Screenings   Food Insecurity: No Food Insecurity (06/16/2023)  Housing: Patient Unable To Answer (06/16/2023)  Transportation Needs: No Transportation Needs (06/16/2023)  Utilities: Not At Risk (06/16/2023)  Depression (PHQ2-9): Medium Risk (03/31/2023)  Tobacco Use: Low Risk  (06/16/2023)    Readmission Risk Interventions     No data to display

## 2023-06-18 NOTE — TOC Progression Note (Signed)
Transition of Care [facility]) - CM/SW Discharge Note   Patient Details  Name: Gabriela Mcgee MRN: [id number] Date of Birth: November 26, 1970  Transition of Care [facility]) CM/SW Contact:  Marlowe Sax, RN Phone Number: 06/18/2023, 9:17 AM   Clinical Narrative:     Reached out to Mitch at [employer] to inquire the ETA for the Hospital bed, awaiting a response   Final next level of care: Home w Home Health Services Barriers to Discharge: No Barriers Identified   Patient Goals and CMS Choice      Discharge Placement                         Discharge Plan and Services Additional resources added to the After Visit Summary for     Discharge Planning Services: CM Consult            DME Arranged: Wheelchair manual, Hospital bed, Trapeze DME Agency: [facility] Date DME Agency Contacted: 06/17/23 Time DME Agency Contacted: [phone] Representative spoke with at DME Agency: Mitch HH Arranged: PT, OT, RN HH Agency: [facility] Date [facility] Agency Contacted: 06/17/23 Time HH Agency Contacted: 1548 Representative spoke with at [facility] Agency: Merry Proud  Social Determinants of Health (SDOH) Interventions SDOH Screenings   Food Insecurity: No Food Insecurity (06/16/2023)  Housing: Patient Unable To Answer (06/16/2023)  Transportation Needs: No Transportation Needs (06/16/2023)  Utilities: Not At Risk (06/16/2023)  Depression (PHQ2-9): Medium Risk (03/31/2023)  Tobacco Use: Low Risk  (06/16/2023)     Readmission Risk Interventions     No data to display

## 2023-06-19 DIAGNOSIS — L03115 Cellulitis of right lower limb: Secondary | ICD-10-CM | POA: Diagnosis not present

## 2023-06-19 LAB — IRON AND TIBC
Iron: 35 ug/dL (ref 28–170)
Saturation Ratios: 17 % (ref 10.4–31.8)
TIBC: 209 ug/dL — ABNORMAL LOW (ref 250–450)
UIBC: 174 ug/dL

## 2023-06-19 LAB — CBC
HCT: 32.4 % — ABNORMAL LOW (ref 36.0–46.0)
Hemoglobin: 10.5 g/dL — ABNORMAL LOW (ref 12.0–15.0)
MCH: 24.4 pg — ABNORMAL LOW (ref 26.0–34.0)
MCHC: 32.4 g/dL (ref 30.0–36.0)
MCV: 75.3 fL — ABNORMAL LOW (ref 80.0–100.0)
Platelets: 186 10*3/uL (ref 150–400)
RBC: 4.3 MIL/uL (ref 3.87–5.11)
RDW: 15.1 % (ref 11.5–15.5)
WBC: 5.6 10*3/uL (ref 4.0–10.5)
nRBC: 0 % (ref 0.0–0.2)

## 2023-06-19 LAB — BASIC METABOLIC PANEL
Anion gap: 8 (ref 5–15)
BUN: 8 mg/dL (ref 6–20)
CO2: 27 mmol/L (ref 22–32)
Calcium: 8.7 mg/dL — ABNORMAL LOW (ref 8.9–10.3)
Chloride: 104 mmol/L (ref 98–111)
Creatinine, Ser: 0.51 mg/dL (ref 0.44–1.00)
GFR, Estimated: >60 mL/min (ref >60)
Glucose, Bld: 110 mg/dL — ABNORMAL HIGH (ref 70–99)
Potassium: 4 mmol/L (ref 3.5–5.1)
Sodium: 139 mmol/L (ref 135–145)

## 2023-06-19 LAB — FERRITIN: Ferritin: 195 ng/mL (ref 11–307)

## 2023-06-19 MED ORDER — HYDROCODONE-ACETAMINOPHEN 5-325 MG PO TABS
1.0000 | ORAL_TABLET | Freq: Four times a day (QID) | ORAL | Status: DC | PRN
  Administered 2023-06-20: 1 via ORAL
  Filled 2023-06-19: qty 1

## 2023-06-19 MED ORDER — BUTALBITAL-APAP-CAFFEINE 50-325-40 MG PO TABS
1.0000 | ORAL_TABLET | Freq: Four times a day (QID) | ORAL | Status: DC | PRN
  Administered 2023-06-19: 1 via ORAL
  Filled 2023-06-19: qty 1

## 2023-06-19 NOTE — Plan of Care (Signed)
  Problem: Education: Goal: Knowledge of General Education information will improve Description: Including pain rating scale, medication(s)/side effects and non-pharmacologic comfort measures Outcome: Progressing   Problem: Clinical Measurements: Goal: Ability to maintain clinical measurements within normal limits will improve Outcome: Progressing   Problem: Activity: Goal: Risk for activity intolerance will decrease Outcome: Progressing   Problem: Coping: Goal: Level of anxiety will decrease Outcome: Progressing   Problem: Elimination: Goal: Will not experience complications related to bowel motility Outcome: Progressing   Problem: Pain Managment: Goal: General experience of comfort will improve Outcome: Progressing   Problem: Safety: Goal: Ability to remain free from injury will improve Outcome: Progressing

## 2023-06-19 NOTE — Progress Notes (Signed)
PROGRESS NOTE    Gabriela Mcgee  ZOX:[id number] DOB: [birth year] DOA: 06/16/2023 PCP: [name], DO   Assessment & Plan:   Principal Problem:   Cellulitis of right thigh Active Problems:   Essential hypertension   Peripheral neuropathy   Abnormal finding present on diagnostic imaging of uterus   Depression   Open wound of right lower extremity  Assessment and Plan: Cellulitis of right thigh: continue on IV rocephin, doxy. Blood cxs NGTD. Urine cx shows insignificant growth. No debridement needed currently as per gen surg. Gen surg recs apprec   HTN: continue to hold losartan    Lobulated uterus: nonemergent CT scan outpatient can be done    Peripheral neuropathy: no longer taking gabapentin. Continue on home dose of amitriptyline    Depression: severity unknown. Continue on home dose of citalopram   Morbid obesity: BMI 91.4. Complicates overall care & prognosis    DVT prophylaxis: lovenox  Code Status: full  Family Communication:  Disposition Plan: likely d/c back home   Level of care: Med-Surg Status is: Inpatient Remains inpatient appropriate because: severity of illness    Consultants:  Gen surg   Procedures:   Antimicrobials: rocephin, doxy   Subjective: Pt c/o fatigue   Objective: Vitals:   06/18/23 0106 06/18/23 0907 06/18/23 1603 06/19/23 0017  BP: 126/66 (!) 129/58 127/64 (!) 118/52  Pulse: 62 70 70 81  Resp: 18 18 20 18   Temp: 98 F (36.7 C) 98.3 F (36.8 C) 97.9 F (36.6 C) 98.4 F (36.9 C)  TempSrc:  Oral    SpO2: 98% 94% 95% 97%  Weight:        Intake/Output Summary (Last 24 hours) at 06/19/2023 0814 Last data filed at 06/18/2023 1912 Gross per 24 hour  Intake 490 ml  Output --  Net 490 ml   Filed Weights   06/16/23 1515  Weight: (!) 205.4 kg    Examination:  General exam: Appears calm & comfortable. Morbidly obese Respiratory system: diminished breath sounds b/l  Cardiovascular system: S1 & S2+. No rubs or clicks   Gastrointestinal system: abd is soft, NT, obese & hypoactive bowel  Central nervous system: alert & oriented. Moves all extremities  Psychiatry: judgement and insight appears at baseline. Flat mood and affect     Data Reviewed: I have personally reviewed following labs and imaging studies  CBC: Recent Labs  Lab 06/16/23 1549 06/17/23 0351 06/19/23 0425  WBC 6.1 5.7 5.6  NEUTROABS 4.0  --   --   HGB 11.4* 10.1* 10.5*  HCT 34.9* 30.9* 32.4*  MCV 75.2* 74.3* 75.3*  PLT 153 175 186   Basic Metabolic Panel: Recent Labs  Lab 06/16/23 1549 06/17/23 0351 06/19/23 0425  NA 138 138 139  K 4.0 3.5 4.0  CL 105 104 104  CO2 30 26 27   GLUCOSE 103* 100* 110*  BUN 11 9 8   CREATININE 0.65 0.53 0.51  CALCIUM 8.9 8.4* 8.7*   GFR: Estimated Creatinine Clearance: 138.8 mL/min (by C-G formula based on SCr of 0.51 mg/dL). Liver Function Tests: Recent Labs  Lab 06/16/23 1549  AST 12*  ALT 8  ALKPHOS 58  BILITOT 1.0  PROT 7.8  ALBUMIN 3.5   No results for input(s): "LIPASE", "AMYLASE" in the last 168 hours. No results for input(s): "AMMONIA" in the last 168 hours. Coagulation Profile: No results for input(s): "INR", "PROTIME" in the last 168 hours. Cardiac Enzymes: No results for input(s): "CKTOTAL", "CKMB", "CKMBINDEX", "TROPONINI" in the last 168  hours. BNP (last 3 results) No results for input(s): "PROBNP" in the last 8760 hours. HbA1C: Recent Labs    06/17/23 0351  HGBA1C 5.9*   CBG: No results for input(s): "GLUCAP" in the last 168 hours. Lipid Profile: No results for input(s): "CHOL", "HDL", "LDLCALC", "TRIG", "CHOLHDL", "LDLDIRECT" in the last 72 hours. Thyroid Function Tests: No results for input(s): "TSH", "T4TOTAL", "FREET4", "T3FREE", "THYROIDAB" in the last 72 hours. Anemia Panel: Recent Labs    06/19/23 0425  FERRITIN 195  TIBC 209*  IRON 35   Sepsis Labs: Recent Labs  Lab 06/16/23 1549 06/16/23 2041  LATICACIDVEN 1.1 1.0    Recent Results  (from the past 240 hour(s))  Blood culture (routine x 2)     Status: None (Preliminary result)   Collection Time: 06/16/23  4:55 PM   Specimen: BLOOD  Result Value Ref Range Status   Specimen Description BLOOD LEFT ANTECUBITAL  Final   Special Requests   Final    BOTTLES DRAWN AEROBIC AND ANAEROBIC Blood Culture adequate volume   Culture   Final    NO GROWTH 3 DAYS Performed at [facility], [address] [place], [place] [address]    Report Status PENDING  Incomplete  Blood culture (routine x 2)     Status: None (Preliminary result)   Collection Time: 06/16/23  5:04 PM   Specimen: BLOOD  Result Value Ref Range Status   Specimen Description BLOOD RIGHT ANTECUBITAL  Final   Special Requests   Final    BOTTLES DRAWN AEROBIC AND ANAEROBIC Blood Culture adequate volume   Culture   Final    NO GROWTH 3 DAYS Performed at [facility], [address], [place], [place] [address]    Report Status PENDING  Incomplete  Urine Culture     Status: Abnormal   Collection Time: 06/17/23 12:52 AM   Specimen: Urine, Random  Result Value Ref Range Status   Specimen Description   Final    URINE, RANDOM Performed at [facility], [address], [place], [place] [address]    Special Requests   Final    NONE Reflexed from [phone] [id number] Performed at [facility], [address] [place], [place] [address]    Culture (A)  Final    <10,000 COLONIES/mL INSIGNIFICANT GROWTH Performed at [facility], [address], [place], [place] [address]    Report Status 06/18/2023 FINAL  Final         Radiology Studies: No results found.      Scheduled Meds:  amitriptyline  25 mg Oral QHS   citalopram  20 mg Oral Daily   doxycycline  100 mg Oral Q12H   enoxaparin (LOVENOX) injection  0.5 mg/kg Subcutaneous Q24H   gabapentin  300 mg Oral TID   [START ON 06/20/2023] influenza vac split trivalent PF  0.5 mL Intramuscular Once   leptospermum manuka honey  1  Application Topical Daily   loratadine  10 mg Oral Daily   oxybutynin  5 mg Oral BID   Vitamin D (Ergocalciferol)  50,000 Units Oral Q7 days   Continuous Infusions:  cefTRIAXone (ROCEPHIN)  IV 2 g (06/18/23 2343)     LOS: 3 days      Charise Killian, MD [facility] Pager [phone]  If 7PM-7AM, please contact night-coverage [email or web address] 06/19/2023, 8:14 AM

## 2023-06-20 DIAGNOSIS — L03115 Cellulitis of right lower limb: Secondary | ICD-10-CM | POA: Diagnosis not present

## 2023-06-20 LAB — CBC
HCT: 31.4 % — ABNORMAL LOW (ref 36.0–46.0)
Hemoglobin: 10.2 g/dL — ABNORMAL LOW (ref 12.0–15.0)
MCH: 24.3 pg — ABNORMAL LOW (ref 26.0–34.0)
MCHC: 32.5 g/dL (ref 30.0–36.0)
MCV: 74.9 fL — ABNORMAL LOW (ref 80.0–100.0)
Platelets: 172 10*3/uL (ref 150–400)
RBC: 4.19 MIL/uL (ref 3.87–5.11)
RDW: 15.1 % (ref 11.5–15.5)
WBC: 5.3 10*3/uL (ref 4.0–10.5)
nRBC: 0 % (ref 0.0–0.2)

## 2023-06-20 LAB — BASIC METABOLIC PANEL
Anion gap: 6 (ref 5–15)
BUN: 9 mg/dL (ref 6–20)
CO2: 27 mmol/L (ref 22–32)
Calcium: 8.6 mg/dL — ABNORMAL LOW (ref 8.9–10.3)
Chloride: 103 mmol/L (ref 98–111)
Creatinine, Ser: 0.46 mg/dL (ref 0.44–1.00)
GFR, Estimated: >60 mL/min (ref >60)
Glucose, Bld: 97 mg/dL (ref 70–99)
Potassium: 3.6 mmol/L (ref 3.5–5.1)
Sodium: 136 mmol/L (ref 135–145)

## 2023-06-20 MED ORDER — LOSARTAN POTASSIUM 25 MG PO TABS
25.0000 mg | ORAL_TABLET | Freq: Every day | ORAL | Status: DC
  Administered 2023-06-20 – 2023-06-21 (×2): 25 mg via ORAL
  Filled 2023-06-20 (×2): qty 1

## 2023-06-20 MED ORDER — GUAIFENESIN ER 600 MG PO TB12
600.0000 mg | ORAL_TABLET | Freq: Two times a day (BID) | ORAL | Status: DC
  Administered 2023-06-20 – 2023-06-21 (×3): 600 mg via ORAL
  Filled 2023-06-20 (×3): qty 1

## 2023-06-20 NOTE — Plan of Care (Signed)

## 2023-06-20 NOTE — Plan of Care (Signed)

## 2023-06-20 NOTE — Progress Notes (Signed)
PROGRESS NOTE    Gabriela Mcgee  WRU:[id number] DOB: [birth year] DOA: 06/16/2023 PCP: [name], DO   Assessment & Plan:   Principal Problem:   Cellulitis of right thigh Active Problems:   Essential hypertension   Peripheral neuropathy   Abnormal finding present on diagnostic imaging of uterus   Depression   Open wound of right lower extremity  Assessment and Plan: Cellulitis of right thigh: continue on IV rocephin, doxy. Blood cxs NGTD. Urine cx shows insignificant growth. No debridement needed currently as per gen surg. Gen surg recs apprec   HTN: restart home dose of losartan    Lobulated uterus: nonemergent CT scan outpatient can be done    Peripheral neuropathy: no longer taking gabapentin. Continue on home dose of amitriptyline    Depression: severity unknown. Continue on home dose of citalopram   Morbid obesity: BMI 91.4. Complicates overall care & prognosis    DVT prophylaxis: lovenox  Code Status: full  Family Communication:  Disposition Plan: likely d/c back home   Level of care: Med-Surg Status is: Inpatient Remains inpatient appropriate because: severity of illness    Consultants:  Gen surg   Procedures:   Antimicrobials: rocephin, doxy   Subjective: Pt c/o congestion   Objective: Vitals:   06/19/23 0927 06/19/23 1746 06/19/23 1811 06/20/23 0047  BP: 139/68 (!) 141/72  (!) 140/73  Pulse: 82 88  84  Resp: 16 18  20   Temp: 98 F (36.7 C) 99.1 F (37.3 C)  98.5 F (36.9 C)  TempSrc:      SpO2: 94% 93%  99%  Weight:   (!) 198.7 kg     Intake/Output Summary (Last 24 hours) at 06/20/2023 0821 Last data filed at 06/19/2023 1930 Gross per 24 hour  Intake 440 ml  Output --  Net 440 ml   Filed Weights   06/16/23 1515 06/19/23 1811  Weight: (!) 205.4 kg (!) 198.7 kg    Examination:  General exam: Appears comfortable. Morbidly obese  Respiratory system: decreased breath sounds b/l  Cardiovascular system: S1/S2+. No rubs or  clicks  Gastrointestinal system: abd is soft, NT, obese & hypoactive bowel sounds  Central nervous system: alert & oriented. Moves all extremities  Psychiatry: judgement and insight appears normal. Appropriate mood and affect    Data Reviewed: I have personally reviewed following labs and imaging studies  CBC: Recent Labs  Lab 06/16/23 1549 06/17/23 0351 06/19/23 0425 06/20/23 0358  WBC 6.1 5.7 5.6 5.3  NEUTROABS 4.0  --   --   --   HGB 11.4* 10.1* 10.5* 10.2*  HCT 34.9* 30.9* 32.4* 31.4*  MCV 75.2* 74.3* 75.3* 74.9*  PLT 153 175 186 172   Basic Metabolic Panel: Recent Labs  Lab 06/16/23 1549 06/17/23 0351 06/19/23 0425 06/20/23 0358  NA 138 138 139 136  K 4.0 3.5 4.0 3.6  CL 105 104 104 103  CO2 30 26 27 27   GLUCOSE 103* 100* 110* 97  BUN 11 9 8 9   CREATININE 0.65 0.53 0.51 0.46  CALCIUM 8.9 8.4* 8.7* 8.6*   GFR: Estimated Creatinine Clearance: 135.3 mL/min (by C-G formula based on SCr of 0.46 mg/dL). Liver Function Tests: Recent Labs  Lab 06/16/23 1549  AST 12*  ALT 8  ALKPHOS 58  BILITOT 1.0  PROT 7.8  ALBUMIN 3.5   No results for input(s): "LIPASE", "AMYLASE" in the last 168 hours. No results for input(s): "AMMONIA" in the last 168 hours. Coagulation Profile: No results for input(s): "  INR", "PROTIME" in the last 168 hours. Cardiac Enzymes: No results for input(s): "CKTOTAL", "CKMB", "CKMBINDEX", "TROPONINI" in the last 168 hours. BNP (last 3 results) No results for input(s): "PROBNP" in the last 8760 hours. HbA1C: No results for input(s): "HGBA1C" in the last 72 hours.  CBG: No results for input(s): "GLUCAP" in the last 168 hours. Lipid Profile: No results for input(s): "CHOL", "HDL", "LDLCALC", "TRIG", "CHOLHDL", "LDLDIRECT" in the last 72 hours. Thyroid Function Tests: No results for input(s): "TSH", "T4TOTAL", "FREET4", "T3FREE", "THYROIDAB" in the last 72 hours. Anemia Panel: Recent Labs    06/19/23 0425  FERRITIN 195  TIBC 209*  IRON  35   Sepsis Labs: Recent Labs  Lab 06/16/23 1549 06/16/23 2041  LATICACIDVEN 1.1 1.0    Recent Results (from the past 240 hour(s))  Blood culture (routine x 2)     Status: None (Preliminary result)   Collection Time: 06/16/23  4:55 PM   Specimen: BLOOD  Result Value Ref Range Status   Specimen Description BLOOD LEFT ANTECUBITAL  Final   Special Requests   Final    BOTTLES DRAWN AEROBIC AND ANAEROBIC Blood Culture adequate volume   Culture   Final    NO GROWTH 4 DAYS Performed at [facility], [address] [place], [place] [address]    Report Status PENDING  Incomplete  Blood culture (routine x 2)     Status: None (Preliminary result)   Collection Time: 06/16/23  5:04 PM   Specimen: BLOOD  Result Value Ref Range Status   Specimen Description BLOOD RIGHT ANTECUBITAL  Final   Special Requests   Final    BOTTLES DRAWN AEROBIC AND ANAEROBIC Blood Culture adequate volume   Culture   Final    NO GROWTH 4 DAYS Performed at [facility], [address], [place], [place] [address]    Report Status PENDING  Incomplete  Urine Culture     Status: Abnormal   Collection Time: 06/17/23 12:52 AM   Specimen: Urine, Random  Result Value Ref Range Status   Specimen Description   Final    URINE, RANDOM Performed at [facility], [address], [place], [place] [address]    Special Requests   Final    NONE Reflexed from [id number] Performed at [facility], [address] [place], [place] [address]    Culture (A)  Final    <10,000 COLONIES/mL INSIGNIFICANT GROWTH Performed at [facility], [address], [place], [place] [address]    Report Status 06/18/2023 FINAL  Final         Radiology Studies: No results found.      Scheduled Meds:  amitriptyline  25 mg Oral QHS   citalopram  20 mg Oral Daily   doxycycline  100 mg Oral Q12H   enoxaparin (LOVENOX) injection  0.5 mg/kg Subcutaneous Q24H   gabapentin  300 mg Oral TID    influenza vac split trivalent PF  0.5 mL Intramuscular Once   leptospermum manuka honey  1 Application Topical Daily   loratadine  10 mg Oral Daily   oxybutynin  5 mg Oral BID   Vitamin D (Ergocalciferol)  50,000 Units Oral Q7 days   Continuous Infusions:  cefTRIAXone (ROCEPHIN)  IV 2 g (06/19/23 2346)     LOS: 4 days      Charise Killian, MD [facility] Pager [phone]  If 7PM-7AM, please contact night-coverage [email or web address] 06/20/2023, 8:21 AM

## 2023-06-21 ENCOUNTER — Other Ambulatory Visit: Payer: Medicaid Other

## 2023-06-21 DIAGNOSIS — L03115 Cellulitis of right lower limb: Secondary | ICD-10-CM | POA: Diagnosis not present

## 2023-06-21 LAB — BASIC METABOLIC PANEL
Anion gap: 11 (ref 5–15)
BUN: 8 mg/dL (ref 6–20)
CO2: 26 mmol/L (ref 22–32)
Calcium: 8.3 mg/dL — ABNORMAL LOW (ref 8.9–10.3)
Chloride: 100 mmol/L (ref 98–111)
Creatinine, Ser: 0.48 mg/dL (ref 0.44–1.00)
GFR, Estimated: >60 mL/min (ref >60)
Glucose, Bld: 95 mg/dL (ref 70–99)
Potassium: 3.6 mmol/L (ref 3.5–5.1)
Sodium: 137 mmol/L (ref 135–145)

## 2023-06-21 LAB — CULTURE, BLOOD (ROUTINE X 2)
Culture: NO GROWTH
Culture: NO GROWTH
Special Requests: ADEQUATE
Special Requests: ADEQUATE

## 2023-06-21 LAB — CBC
HCT: 30.7 % — ABNORMAL LOW (ref 36.0–46.0)
Hemoglobin: 10 g/dL — ABNORMAL LOW (ref 12.0–15.0)
MCH: 24.3 pg — ABNORMAL LOW (ref 26.0–34.0)
MCHC: 32.6 g/dL (ref 30.0–36.0)
MCV: 74.7 fL — ABNORMAL LOW (ref 80.0–100.0)
Platelets: 167 10*3/uL (ref 150–400)
RBC: 4.11 MIL/uL (ref 3.87–5.11)
RDW: 15.1 % (ref 11.5–15.5)
WBC: 5.8 10*3/uL (ref 4.0–10.5)
nRBC: 0 % (ref 0.0–0.2)

## 2023-06-21 MED ORDER — CEFDINIR 300 MG PO CAPS: 300.0000 mg | ORAL_CAPSULE | Freq: Two times a day (BID) | ORAL | 0 refills | Status: AC

## 2023-06-21 MED ORDER — HYDROCODONE-ACETAMINOPHEN 5-325 MG PO TABS: 1.0000 | ORAL_TABLET | Freq: Four times a day (QID) | ORAL | 0 refills | Status: AC | PRN

## 2023-06-21 MED ORDER — DOXYCYCLINE HYCLATE 100 MG PO TABS: 100.0000 mg | ORAL_TABLET | Freq: Two times a day (BID) | ORAL | 0 refills | Status: AC

## 2023-06-21 NOTE — Plan of Care (Signed)
  Problem: Education: Goal: Knowledge of General Education information will improve Description: Including pain rating scale, medication(s)/side effects and non-pharmacologic comfort measures Outcome: Progressing   Problem: Clinical Measurements: Goal: Ability to maintain clinical measurements within normal limits will improve Outcome: Progressing Goal: Will remain free from infection Outcome: Progressing Goal: Respiratory complications will improve Outcome: Progressing Goal: Cardiovascular complication will be avoided Outcome: Progressing   Problem: Activity: Goal: Risk for activity intolerance will decrease Outcome: Progressing   Problem: Elimination: Goal: Will not experience complications related to bowel motility Outcome: Progressing Goal: Will not experience complications related to urinary retention Outcome: Progressing   Problem: Pain Managment: Goal: General experience of comfort will improve Outcome: Progressing   Problem: Skin Integrity: Goal: Risk for impaired skin integrity will decrease Outcome: Not Progressing

## 2023-06-21 NOTE — Plan of Care (Signed)
Patient discharged per MD orders at this time.All dc instructions,medications and education reviewed with the patient.Pt expressed understanding and will comply with dc instructions.follow up appointments was also communicated to the patient.no verbal c/o or any ssx of distress.Pt was dIscharged home with HH/PT/OT/Nursing services per order.Pt was transported home by [facility] services personnel on a stretcher.

## 2023-06-21 NOTE — TOC Progression Note (Signed)
Transition of Care [facility]) - Progression Note    Patient Details  Name: Gabriela Mcgee MRN: [id number] Date of Birth: [birth year]  Transition [facility] Care [facility]) CM/SW Contact  Marlowe Sax, RN Phone Number: 06/21/2023, 8:44 AM  Clinical Narrative:    Spoke with the patient and she stated that the Wheelchair was not delivered to her house, I called Mitch with [employer] and he is checking with the company and will let me know   Expected Discharge Plan: Home w Home Health Services Barriers to Discharge: No Barriers Identified  Expected Discharge Plan and Services   Discharge Planning Services: CM Consult   Living arrangements for the past 2 months: Single Family Home                 DME Arranged: Wheelchair manual, Hospital bed, Trapeze DME Agency: [employer] Date DME Agency Contacted: 06/17/23 Time DME Agency Contacted: 1547 Representative spoke with at DME Agency: Mitch HH Arranged: PT, OT, RN HH Agency: [facility] Date [facility] Agency Contacted: 06/17/23 Time HH Agency Contacted: 1548 Representative spoke with at [facility] Agency: Merry Proud   Social Determinants of Health (SDOH) Interventions SDOH Screenings   Food Insecurity: No Food Insecurity (06/16/2023)  Housing: Patient Unable To Answer (06/16/2023)  Transportation Needs: No Transportation Needs (06/16/2023)  Utilities: Not At Risk (06/16/2023)  Depression (PHQ2-9): Medium Risk (03/31/2023)  Tobacco Use: Low Risk  (06/16/2023)    Readmission Risk Interventions     No data to display

## 2023-06-21 NOTE — Patient Outreach (Signed)
Patient is currently inpatient. BSW will follow up with patient once discharged.   Abelino Derrick, MHA [facility]  Managed [facility] Social Worker [phone]

## 2023-06-21 NOTE — Plan of Care (Signed)

## 2023-06-21 NOTE — TOC Transition Note (Signed)
Transition of Care [facility]) - CM/SW Discharge Note   Patient Details  Name: Gabriela Mcgee MRN: [id number] Date of Birth: [birth year]  Transition of Care [facility]) CM/SW Contact:  Marlowe Sax, RN Phone Number: 06/21/2023, 1:46 PM   Clinical Narrative:    Called EMS to transport the patient to [address] [place], she is 5th on the list, I notified the patient   Final next level of care: Home w Home Health Services Barriers to Discharge: No Barriers Identified   Patient Goals and CMS Choice      Discharge Placement                         Discharge Plan and Services Additional resources added to the After Visit Summary for     Discharge Planning Services: CM Consult            DME Arranged: Wheelchair manual, Hospital bed, Trapeze DME Agency: [employer] Date DME Agency Contacted: 06/17/23 Time DME Agency Contacted: 1547 Representative spoke with at DME Agency: Mitch HH Arranged: PT, OT, RN HH Agency: [facility] Date [facility] Agency Contacted: 06/17/23 Time HH Agency Contacted: 1548 Representative spoke with at [facility] Agency: Merry Proud  Social Determinants of Health (SDOH) Interventions SDOH Screenings   Food Insecurity: No Food Insecurity (06/16/2023)  Housing: Patient Unable To Answer (06/16/2023)  Transportation Needs: No Transportation Needs (06/16/2023)  Utilities: Not At Risk (06/16/2023)  Depression (PHQ2-9): Medium Risk (03/31/2023)  Tobacco Use: Low Risk  (06/16/2023)     Readmission Risk Interventions     No data to display

## 2023-06-21 NOTE — TOC Progression Note (Signed)
Transition of Care [facility]) - Progression Note    Patient Details  Name: Gabriela Mcgee MRN: [id number] Date of Birth: 1971-01-09  Transition of Care [facility]) CM/SW Contact  Marlowe Sax, RN Phone Number: 06/21/2023, 9:55 AM  Clinical Narrative:     [employer] confirmed that they have a delivery ticket signed by family for the wheelchair, they are sending out a delivery driver to check to make sure, they are taking another WC in case it is not there, I notified the patient  Expected Discharge Plan: Home w Home Health Services Barriers to Discharge: No Barriers Identified  Expected Discharge Plan and Services   Discharge Planning Services: CM Consult   Living arrangements for the past 2 months: Single Family Home                 DME Arranged: Wheelchair manual, Hospital bed, Trapeze DME Agency: [employer] Date DME Agency Contacted: 06/17/23 Time DME Agency Contacted: 1547 Representative spoke with at DME Agency: Mitch HH Arranged: PT, OT, RN HH Agency: [facility] Date [facility] Agency Contacted: 06/17/23 Time HH Agency Contacted: 1548 Representative spoke with at [facility] Agency: Merry Proud   Social Determinants of Health (SDOH) Interventions SDOH Screenings   Food Insecurity: No Food Insecurity (06/16/2023)  Housing: Patient Unable To Answer (06/16/2023)  Transportation Needs: No Transportation Needs (06/16/2023)  Utilities: Not At Risk (06/16/2023)  Depression (PHQ2-9): Medium Risk (03/31/2023)  Tobacco Use: Low Risk  (06/16/2023)    Readmission Risk Interventions     No data to display

## 2023-06-21 NOTE — Discharge Summary (Signed)
Physician Discharge Summary  ODELLA APPELHANS UUV:[id number] DOB: [birth year] DOA: 06/16/2023  PCP: [name], DO  Admit date: 06/16/2023 Discharge date: 06/21/2023  Admitted From: home  Disposition:  home   Recommendations for Outpatient Follow-up:  Follow up with PCP in 1-2 weeks   Home Health: yes  Equipment/Devices:  Discharge Condition: stable CODE STATUS: full  Diet recommendation: Heart Healthy   Brief/Interim Summary: HPI was taken from Dr. Arville Care: KERISSA COIA is a 52 y.o. female with medical history significant for essential hypertension OSA on BiPAP, peripheral neuropathy, bilateral lower extremity lymphedema and GERD, who presented to the emergency room with acute onset of right leg wound with drainage that has been fairly persistent over the last 1 to 2 weeks.  Over the last 24 hours the pain has been significantly worse in her drainage has been foul-smelling.  Her home health nurse came today to check on it and sent her to the ER.  She was concerned about an abscess.  The patient has a bariatric bed and chair recently they broke and she has not been unable to take much pressure off of it.  No fever or chills.  No nausea or vomiting or abdominal pain.  No chest pain or palpitations.  No cough or wheezing or hemoptysis.  No dysuria, oliguria or hematuria or flank pain.   ED Course: When she came to the ER vital signs were within normal.  Labs revealed unremarkable CMP.  CBC showed mild anemia with hemoglobin 11.4 hematocrit 34.9 slightly lower than previous levels in July.  Blood cultures were drawn. EKG as reviewed by me : None Imaging: Right femur CT showed the following: 1. Extensive asymmetric soft tissue swelling/edema along the medial aspect of the right thigh, favoring cellulitis/soft tissue infection. No discrete walled-off abscess seen. 2. There is lobulated appearance of uterus and bilateral adnexa, not well evaluated on this exam. Further evaluation with  dedicated nonemergent CT scan of the pelvis is recommended.   The patient was given IV vancomycin, Flagyl and cefepime.  She will be admitted to a medical bed for further evaluation and management.  Discharge Diagnoses:  Principal Problem:   Cellulitis of right thigh Active Problems:   Essential hypertension   Peripheral neuropathy   Abnormal finding present on diagnostic imaging of uterus   Depression   Open wound of right lower extremity  Cellulitis of right thigh: continue on IV rocephin, doxy while inpatient and d/c home w/ po cefdinir & doxy to complete the course. Blood cxs NGTD. Urine cx shows insignificant growth. No debridement needed currently as per gen surg. Gen surg recs apprec   HTN: restart home dose of losartan    Lobulated uterus: nonemergent CT scan outpatient can be done    Peripheral neuropathy: no longer taking gabapentin. Continue on home dose of amitriptyline    Depression: severity unknown. Continue on home dose of citalopram    Morbid obesity: BMI 91.4. Complicates overall care & prognosis   Discharge Instructions  Discharge Instructions     Diet - low sodium heart healthy   Complete by: As directed    Discharge instructions   Complete by: As directed    F/u w/ PCP in 1-2 weeks   Discharge wound care:   Complete by: As directed    Wound care  Daily      Comments: Clean R posterior medial thigh full thickness wound with Vashe wound cleanser Hart Rochester [phone]), apply Medihoney to wound bed daily, cover with dry  gauze and ABD pad.  May tape ABD pad  to secure if needed.   Increase activity slowly   Complete by: As directed       Allergies as of 06/21/2023       Reactions   Lisinopril Swelling   Silicone Dermatitis   Paper and silk tape OK per patient 04/10/2014   Morphine    Very hard to wake up afterwards   Fluconazole Rash   Penicillin G Rash   Tape Dermatitis, Rash   Oozing        Medication List     STOP taking these medications     gabapentin 300 MG capsule Commonly known as: NEURONTIN       TAKE these medications    albuterol 108 (90 Base) MCG/ACT inhaler Commonly known as: VENTOLIN HFA Inhale 1-2 puffs into the lungs every 6 (six) hours as needed for shortness of breath.   amitriptyline 25 MG tablet Commonly known as: ELAVIL Take 1 tablet (25 mg total) by mouth at bedtime.   cefdinir 300 MG capsule Commonly known as: OMNICEF Take 1 capsule (300 mg total) by mouth 2 (two) times daily for 4 days.   citalopram 20 MG tablet Commonly known as: CELEXA Take 1 tablet (20 mg total) by mouth daily.   doxycycline 100 MG tablet Commonly known as: VIBRA-TABS Take 1 tablet (100 mg total) by mouth every 12 (twelve) hours for 4 days.   HYDROcodone-acetaminophen 5-325 MG tablet Commonly known as: NORCO/VICODIN Take 1 tablet by mouth every 6 (six) hours as needed for up to 5 days for moderate pain or severe pain.   ibuprofen 200 MG tablet Commonly known as: ADVIL Take 600 mg by mouth every 6 (six) hours as needed.   loratadine 10 MG tablet Commonly known as: CLARITIN Take 10 mg by mouth daily.   losartan 25 MG tablet Commonly known as: COZAAR Take 1 tablet by mouth once daily   oxybutynin 5 MG 24 hr tablet Commonly known as: DITROPAN-XL Take 1 tablet (5 mg total) by mouth 2 (two) times daily.   Vitamin D (Ergocalciferol) 1.25 MG (50000 UNIT) Caps capsule Commonly known as: DRISDOL Take 1 capsule (50,000 Units total) by mouth every 7 (seven) days.               Durable Medical Equipment  (From admission, onward)           Start     Ordered   06/17/23 1454  For home use only DME Hospital bed  Once       Comments: Bariatric bed needed, 451 lbs  Question Answer Comment  Length of Need 6 Months   Patient has (list medical condition): cellulitis, Peripheral neuropathy, open wound right lower extremity, venous stasis ulcer, lymphedema bilateral lower extremity Chronic fatig,   The above  medical condition requires: Patient requires the ability to reposition frequently   Bed type Semi-electric   Trapeze Bar Yes   Support Surface: Low Air loss Mattress      06/17/23 1453   06/17/23 1407  For home use only DME standard manual wheelchair with seat cushion  Once       Comments: Will Need bariatric Wheel Chair. Patient suffers from Lymphedema bilaterally, Cellulitis, which impairs their ability to perform daily activities like ADLs  in the home.  A Walking aide will not resolve issue with performing activities of daily living. A wheelchair will allow patient to safely perform daily activities. Patient can safely propel the wheelchair  in the home or has a caregiver who can provide assistance. Length of need 12 months. Accessories: elevating leg rests (ELRs), wheel locks, extensions and anti-tippers.   06/17/23 1408              Discharge Care Instructions  (From admission, onward)           Start     Ordered   06/21/23 0000  Discharge wound care:       Comments: Wound care  Daily      Comments: Clean R posterior medial thigh full thickness wound with Vashe wound cleanser Hart Rochester [phone]), apply Medihoney to wound bed daily, cover with dry gauze and ABD pad.  May tape ABD pad  to secure if needed.   06/21/23 1328            Allergies  Allergen Reactions   Lisinopril Swelling   Silicone Dermatitis    Paper and silk tape OK per patient 04/10/2014   Morphine     Very hard to wake up afterwards   Fluconazole Rash   Penicillin G Rash   Tape Dermatitis and Rash    Oozing    Consultations:    Procedures/Studies: CT FEMUR RIGHT W CONTRAST  Result Date: 06/16/2023 CLINICAL DATA:  Soft tissue infection suspected, thigh, no prior imaging. EXAM: CT OF THE LOWER RIGHT EXTREMITY WITH CONTRAST TECHNIQUE: Multidetector CT imaging of the lower right extremity was performed according to the standard protocol following intravenous contrast administration. RADIATION DOSE  REDUCTION: This exam was performed according to the departmental dose-optimization program which includes automated exposure control, adjustment of the mA and/or kV according to patient size and/or use of iterative reconstruction technique. CONTRAST:  OMNIPAQUE IOHEXOL 350 MG/ML SOLN COMPARISON:  None Available. FINDINGS: Bones/Joint/Cartilage No acute fracture or dislocation. No aggressive osseous lesion. Bilateral hip joints are within normal limits. Ligaments Suboptimally assessed by CT. Muscles and Tendons No focal lesion. Soft tissues There is extensive soft tissue swelling/edema throughout bilateral imaged thighs, with asymmetric involvement of the medial aspect of the right thigh. Multiple subcutaneous vessels are seen traversing through this edematous tissue. However, no discrete walled-off abscess or collection seen. There is lobulated appearance of uterus and bilateral adnexa, not well evaluated on this exam. Further evaluation with dedicated nonemergent CT scan of the pelvis is recommended. IMPRESSION: 1. Extensive asymmetric soft tissue swelling/edema along the medial aspect of the right thigh, favoring cellulitis/soft tissue infection. No discrete walled-off abscess seen. 2. There is lobulated appearance of uterus and bilateral adnexa, not well evaluated on this exam. Further evaluation with dedicated nonemergent CT scan of the pelvis is recommended. Electronically Signed   By: Jules Schick M.D.   On: 06/16/2023 18:32   (Echo, Carotid, EGD, Colonoscopy, ERCP)    Subjective: Pt c/o fatigue    Discharge Exam: Vitals:   06/21/23 0007 06/21/23 0841  BP: 121/64 127/64  Pulse: 85 72  Resp: 18 17  Temp: 98.5 F (36.9 C)   SpO2: 93% 99%   Vitals:   06/20/23 0853 06/20/23 1606 06/21/23 0007 06/21/23 0841  BP: (!) 145/72 135/75 121/64 127/64  Pulse: 75 71 85 72  Resp: 14 14 18 17   Temp: 97.6 F (36.4 C) 98.3 F (36.8 C) 98.5 F (36.9 C)   TempSrc:      SpO2: 96% 96% 93% 99%   Weight:        General: Pt is alert, awake, not in acute distress Cardiovascular:  S1/S2 +, no rubs, no gallops  Respiratory: CTA bilaterally, no wheezing, no rhonchi Abdominal: Soft, NT, obese, bowel sounds + Extremities: no cyanosis    The results of significant diagnostics from this hospitalization (including imaging, microbiology, ancillary and laboratory) are listed below for reference.     Microbiology: Recent Results (from the past 240 hour(s))  Blood culture (routine x 2)     Status: None (Preliminary result)   Collection Time: 06/16/23  4:55 PM   Specimen: BLOOD  Result Value Ref Range Status   Specimen Description BLOOD LEFT ANTECUBITAL  Final   Special Requests   Final    BOTTLES DRAWN AEROBIC AND ANAEROBIC Blood Culture adequate volume   Culture   Final    NO GROWTH 4 DAYS Performed at [facility], [address] [place], [place] [address]    Report Status PENDING  Incomplete  Blood culture (routine x 2)     Status: None (Preliminary result)   Collection Time: 06/16/23  5:04 PM   Specimen: BLOOD  Result Value Ref Range Status   Specimen Description BLOOD RIGHT ANTECUBITAL  Final   Special Requests   Final    BOTTLES DRAWN AEROBIC AND ANAEROBIC Blood Culture adequate volume   Culture   Final    NO GROWTH 4 DAYS Performed at [facility], [address] [place], [place] [address]    Report Status PENDING  Incomplete  Urine Culture     Status: Abnormal   Collection Time: 06/17/23 12:52 AM   Specimen: Urine, Random  Result Value Ref Range Status   Specimen Description   Final    URINE, RANDOM Performed at [facility], [address], [place], [place] [address]    Special Requests   Final    NONE Reflexed from [id number] Performed at [facility], [address] [place], [place] [address]    Culture (A)  Final    <10,000 COLONIES/mL INSIGNIFICANT GROWTH Performed at [facility], [address]  [place], [place] [address]    Report Status 06/18/2023 FINAL  Final     Labs: BNP (last 3 results) No results for input(s): "BNP" in the last 8760 hours. Basic Metabolic Panel: Recent Labs  Lab 06/16/23 1549 06/17/23 0351 06/19/23 0425 06/20/23 0358 06/21/23 0312  NA 138 138 139 136 137  K 4.0 3.5 4.0 3.6 3.6  CL 105 104 104 103 100  CO2 30 26 27 27 26   GLUCOSE 103* 100* 110* 97 95  BUN 11 9 8 9 8   CREATININE 0.65 0.53 0.51 0.46 0.48  CALCIUM 8.9 8.4* 8.7* 8.6* 8.3*   Liver Function Tests: Recent Labs  Lab 06/16/23 1549  AST 12*  ALT 8  ALKPHOS 58  BILITOT 1.0  PROT 7.8  ALBUMIN 3.5   No results for input(s): "LIPASE", "AMYLASE" in the last 168 hours. No results for input(s): "AMMONIA" in the last 168 hours. CBC: Recent Labs  Lab 06/16/23 1549 06/17/23 0351 06/19/23 0425 06/20/23 0358 06/21/23 0312  WBC 6.1 5.7 5.6 5.3 5.8  NEUTROABS 4.0  --   --   --   --   HGB 11.4* 10.1* 10.5* 10.2* 10.0*  HCT 34.9* 30.9* 32.4* 31.4* 30.7*  MCV 75.2* 74.3* 75.3* 74.9* 74.7*  PLT 153 175 186 172 167   Cardiac Enzymes: No results for input(s): "CKTOTAL", "CKMB", "CKMBINDEX", "TROPONINI" in the last 168 hours. BNP: Invalid input(s): "POCBNP" CBG: No results for input(s): "GLUCAP" in the last 168 hours. D-Dimer No results for input(s): "DDIMER" in  the last 72 hours. Hgb A1c No results for input(s): "HGBA1C" in the last 72 hours. Lipid Profile No results for input(s): "CHOL", "HDL", "LDLCALC", "TRIG", "CHOLHDL", "LDLDIRECT" in the last 72 hours. Thyroid function studies No results for input(s): "TSH", "T4TOTAL", "T3FREE", "THYROIDAB" in the last 72 hours.  Invalid input(s): "FREET3" Anemia work up Recent Labs    06/19/23 0425  FERRITIN 195  TIBC 209*  IRON 35   Urinalysis    Component Value Date/Time   COLORURINE YELLOW (A) 06/17/2023 0052   APPEARANCEUR CLOUDY (A) 06/17/2023 0052   LABSPEC 1.019 06/17/2023 0052   PHURINE 7.0 06/17/2023 0052   GLUCOSEU  NEGATIVE 06/17/2023 0052   HGBUR SMALL (A) 06/17/2023 0052   BILIRUBINUR NEGATIVE 06/17/2023 0052   KETONESUR NEGATIVE 06/17/2023 0052   PROTEINUR NEGATIVE 06/17/2023 0052   NITRITE POSITIVE (A) 06/17/2023 0052   LEUKOCYTESUR SMALL (A) 06/17/2023 0052   Sepsis Labs Recent Labs  Lab 06/17/23 0351 06/19/23 0425 06/20/23 0358 06/21/23 0312  WBC 5.7 5.6 5.3 5.8   Microbiology Recent Results (from the past 240 hour(s))  Blood culture (routine x 2)     Status: None (Preliminary result)   Collection Time: 06/16/23  4:55 PM   Specimen: BLOOD  Result Value Ref Range Status   Specimen Description BLOOD LEFT ANTECUBITAL  Final   Special Requests   Final    BOTTLES DRAWN AEROBIC AND ANAEROBIC Blood Culture adequate volume   Culture   Final    NO GROWTH 4 DAYS Performed at [facility], [address] [place], [place] [address]    Report Status PENDING  Incomplete  Blood culture (routine x 2)     Status: None (Preliminary result)   Collection Time: 06/16/23  5:04 PM   Specimen: BLOOD  Result Value Ref Range Status   Specimen Description BLOOD RIGHT ANTECUBITAL  Final   Special Requests   Final    BOTTLES DRAWN AEROBIC AND ANAEROBIC Blood Culture adequate volume   Culture   Final    NO GROWTH 4 DAYS Performed at [facility], [address], [place], [place] [address]    Report Status PENDING  Incomplete  Urine Culture     Status: Abnormal   Collection Time: 06/17/23 12:52 AM   Specimen: Urine, Random  Result Value Ref Range Status   Specimen Description   Final    URINE, RANDOM Performed at [facility], [address] [place], [place] [address]    Special Requests   Final    NONE Reflexed from [id number] Performed at [facility], [address] [place], [place] [address]    Culture (A)  Final    <10,000 COLONIES/mL INSIGNIFICANT GROWTH Performed at [facility], [address] [place], [place] [address]    Report Status  06/18/2023 FINAL  Final     Time coordinating discharge: Over 30 minutes  SIGNED:   Charise Killian, MD  [facility] 06/21/2023, 1:29 PM Pager   If 7PM-7AM, please contact night-coverage

## 2023-07-05 ENCOUNTER — Ambulatory Visit: Payer: Medicaid Other

## 2023-07-05 DIAGNOSIS — Z9189 Other specified personal risk factors, not elsewhere classified: Secondary | ICD-10-CM | POA: Insufficient documentation

## 2023-07-09 NOTE — Telephone Encounter (Signed)
Called patient. NA.

## 2023-07-12 DIAGNOSIS — J45909 Unspecified asthma, uncomplicated: Secondary | ICD-10-CM | POA: Diagnosis not present

## 2023-07-12 DIAGNOSIS — Z9181 History of falling: Secondary | ICD-10-CM | POA: Diagnosis not present

## 2023-07-12 DIAGNOSIS — D649 Anemia, unspecified: Secondary | ICD-10-CM | POA: Diagnosis not present

## 2023-07-12 DIAGNOSIS — I10 Essential (primary) hypertension: Secondary | ICD-10-CM | POA: Diagnosis not present

## 2023-07-12 DIAGNOSIS — R32 Unspecified urinary incontinence: Secondary | ICD-10-CM | POA: Diagnosis not present

## 2023-07-12 DIAGNOSIS — G4730 Sleep apnea, unspecified: Secondary | ICD-10-CM | POA: Diagnosis not present

## 2023-07-12 DIAGNOSIS — Z6841 Body Mass Index (BMI) 40.0 and over, adult: Secondary | ICD-10-CM | POA: Diagnosis not present

## 2023-07-12 DIAGNOSIS — G629 Polyneuropathy, unspecified: Secondary | ICD-10-CM | POA: Diagnosis not present

## 2023-07-12 DIAGNOSIS — I890 Lymphedema, not elsewhere classified: Secondary | ICD-10-CM | POA: Diagnosis not present

## 2023-07-13 ENCOUNTER — Other Ambulatory Visit: Payer: Self-pay

## 2023-07-13 DIAGNOSIS — J45909 Unspecified asthma, uncomplicated: Secondary | ICD-10-CM | POA: Diagnosis not present

## 2023-07-13 DIAGNOSIS — I890 Lymphedema, not elsewhere classified: Secondary | ICD-10-CM | POA: Diagnosis not present

## 2023-07-13 DIAGNOSIS — R32 Unspecified urinary incontinence: Secondary | ICD-10-CM | POA: Diagnosis not present

## 2023-07-13 DIAGNOSIS — Z9181 History of falling: Secondary | ICD-10-CM | POA: Diagnosis not present

## 2023-07-13 DIAGNOSIS — G629 Polyneuropathy, unspecified: Secondary | ICD-10-CM | POA: Diagnosis not present

## 2023-07-13 DIAGNOSIS — D649 Anemia, unspecified: Secondary | ICD-10-CM | POA: Diagnosis not present

## 2023-07-13 DIAGNOSIS — G4730 Sleep apnea, unspecified: Secondary | ICD-10-CM | POA: Diagnosis not present

## 2023-07-13 DIAGNOSIS — I10 Essential (primary) hypertension: Secondary | ICD-10-CM | POA: Diagnosis not present

## 2023-07-13 DIAGNOSIS — Z6841 Body Mass Index (BMI) 40.0 and over, adult: Secondary | ICD-10-CM | POA: Diagnosis not present

## 2023-07-13 NOTE — Patient Instructions (Signed)
Visit Information  [name] was given information about Medicaid Managed Care team care coordination services as a part of their Healthy [employer] Medicaid benefit. Gabriela Mcgee verbally consented to engagement with the [facility] Managed Care team.   If you are experiencing a medical emergency, please call 911 or report to your local emergency department or urgent care.   If you have a non-emergency medical problem during routine business hours, please contact your provider's office and ask to speak with a nurse.   For questions related to your Healthy [facility] health plan, please call: [phone] or visit the homepage here: [email or web address]  If you would like to schedule transportation through your Healthy [facility] plan, please call the following number at least 2 days in advance of your appointment: [phone]  For information about your ride after you set it up, call Ride Assist at [phone]. Use this number to activate a Will Call pickup, or if your transportation is late for a scheduled pickup. Use this number, too, if you need to make a change or cancel a previously scheduled reservation.  If you need transportation services right away, call [phone]. The after-hours call center is staffed 24 hours to handle ride assistance and urgent reservation requests (including discharges) 365 days a year. Urgent trips include sick visits, hospital discharge requests and life-sustaining treatment.  Call the [facility] Line at [phone], at any time, 24 hours a day, 7 days a week. If you are in danger or need immediate medical attention call 911.  If you would like help to quit smoking, call 1-800-QUIT-NOW (207-020-3827) OR Espaol: 1-855-Djelo-Ya (3-818-299-3716) o para ms informacin haga clic aqu or Text READY to 967-893 to register via text  [name] - following are the goals we discussed in your visit today:   Goals  Addressed   None       Social Worker will follow up in 30 days .   Gus Puma, Kenard Gower, MHA [facility]  Managed Medicaid Social Worker [phone]   Following is a copy of your plan of care:  There are no care plans that you recently modified to display for this patient.

## 2023-07-13 NOTE — Patient Outreach (Signed)
Medicaid Managed Care Social Work Note  07/13/2023 Name:  Gabriela Mcgee MRN:  [id number] DOB:  [birth year]  Gabriela Mcgee is an 52 y.o. year old female who is a primary patient of [name], DO.  The [facility] Managed Care Coordination team was consulted for assistance with:   disability resources  Ms. Drumwright was given information about Medicaid Managed Care Coordination team services today. [name] Patient agreed to services and verbal consent obtained.  Engaged with patient  for by telephone forfollow up visit in response to referral for case management and/or care coordination services.   Assessments/Interventions:  Review of past medical history, allergies, medications, health status, including review of consultants reports, laboratory and other test data, was performed as part of comprehensive evaluation and provision of chronic care management services.  SDOH: (Social Determinant of Health) assessments and interventions performed: SDOH Interventions    Flowsheet Row Patient Outreach Telephone from 03/25/2023 in [facility] Office Visit from 03/17/2023 in [facility]  SDOH Interventions    Food Insecurity Interventions Intervention Not Indicated --  Housing Interventions Intervention Not Indicated --  Transportation Interventions Intervention Not Indicated --  Utilities Interventions Intervention Not Indicated --  Depression Interventions/Treatment  -- Medication     BSW completed a telephone outreach with patient, she states things are going well. She did get a new bed, but thinks she threw the disability resources away. BSW and patient agreed for resources to be resent via mail. Patient states no other resources are needed at this time. BSW will do another follow up to ensure patient receives resources.  Advanced Directives Status:  Not addressed in this encounter.  Care Plan                 Allergies   Allergen Reactions   Lisinopril Swelling   Silicone Dermatitis    Paper and silk tape OK per patient 04/10/2014   Morphine     Very hard to wake up afterwards   Fluconazole Rash   Penicillin G Rash   Tape Dermatitis and Rash    Oozing    Medications Reviewed Today   Medications were not reviewed in this encounter     Patient Active Problem List   Diagnosis Date Noted   At high risk for aspiration 07/05/2023   Open wound of right lower extremity 06/17/2023   Cellulitis of right thigh 06/16/2023   Essential hypertension 06/16/2023   Peripheral neuropathy 06/16/2023   Depression 06/16/2023   Abnormal finding present on diagnostic imaging of uterus 06/16/2023   Venous stasis ulcer of left lower extremity (HCC) 03/25/2023   Urinary incontinence 03/25/2023   Peripheral neuropathic pain 03/25/2023   Primary hypertension 03/25/2023   Depression, recurrent (HCC) 03/25/2023   Lymphedema of both lower extremities 03/25/2023   Difficulty maintaining body in lying position 03/25/2023   Chronic fatigue 03/25/2023   Annual physical exam 03/25/2023   Morbid obesity with BMI of 60.0-69.9, adult (HCC) 03/17/2023   Sleep apnea treated with nocturnal bilevel positive airway pressure (BPAP) 03/17/2023   Increased weakness when ambulating 03/17/2023   GERD (gastroesophageal reflux disease) 04/10/2014   History of DVT (deep vein thrombosis) 04/10/2014    Conditions to be addressed/monitored per PCP order:   disability resources  There are no care plans that you recently modified to display for this patient.   Follow up:  Patient agrees to Care Plan and Follow-up.  Plan: The Managed Medicaid care management team  will reach out to the patient again over the next 30 days.  Date/time of next scheduled Social Work care management/care coordination outreach:  08/19/23  Gus Puma, Kenard Gower, [facility]  Managed [facility] Social Worker [phone]

## 2023-07-15 ENCOUNTER — Other Ambulatory Visit: Payer: Self-pay | Admitting: *Deleted

## 2023-07-15 NOTE — Patient Instructions (Addendum)
Visit Information  [name]  - as a part of your Medicaid benefit, you are eligible for care management and care coordination services at no cost or copay. I was unable to reach you by phone today but would be happy to help you with your health related needs. Please feel free to call me @ [phone].   A member of the Managed Medicaid care management team will reach out to you again over the next 7 days.   Gabriela Emms RN, BSN [place]  [facility] Health RN Care Coordinator [phone]  Visit Information  [name] was given information about Medicaid Managed Care team care coordination services as a part of their [employer] Medicaid benefit. Gabriela Mcgee [place] verbally consented to engagement with the [facility] Managed Care team.   If you are experiencing a medical emergency, please call 911 or report to your local emergency department or urgent care.   If you have a non-emergency medical problem during routine business hours, please contact your provider's office and ask to speak with a nurse.   For questions related to your Healthy [facility] health plan, please call: [phone] or visit the homepage here: [email or web address]  If you would like to schedule transportation through your Healthy [facility] plan, please call the following number at least 2 days in advance of your appointment: [phone]  For information about your ride after you set it up, call Ride Assist at [phone]. Use this number to activate a Will Call pickup, or if your transportation is late for a scheduled pickup. Use this number, too, if you need to make a change or cancel a previously scheduled reservation.  If you need transportation services right away, call [phone]. The after-hours call center is staffed 24 hours to handle ride assistance and urgent reservation requests (including discharges) 365 days a year. Urgent  trips include sick visits, hospital discharge requests and life-sustaining treatment.  Call the [facility] Line at [phone], at any time, 24 hours a day, 7 days a week. If you are in danger or need immediate medical attention call 911.  If you would like help to quit smoking, call 1-800-QUIT-NOW (6075155082) OR Espaol: 1-855-Djelo-Ya (8-841-660-6301) o para ms informacin haga clic aqu or Text READY to 601-093 to register via text  [name],   Please see education materials related to health maintenance provided by MyChart link.  Patient verbalizes understanding of instructions and care plan provided today and agrees to view in MyChart. Active MyChart status and patient understanding of how to access instructions and care plan via MyChart confirmed with patient.     Telephone follow up appointment with Managed Medicaid care management team member scheduled for:08/10/23 at 1:15pm  Gabriela Emms RN, BSN [place]  Value-Based Care [facility] Health RN Care Coordinator [phone]   Following is a copy of your plan of care:  Care Plan : RN Care Manager Plan of Care  Updates made by [name], RN since 07/15/2023 12:00 AM     Problem: Health Management needs related to Lymphedema      Long-Range Goal: Development of Plan of Care to address Health Management needs related to Lymphedema   Start Date: 03/25/2023  Expected End Date: 06/23/2023  Note:   Current Barriers:  Chronic Disease Management support and education needs related to Lymphedema  RNCM Clinical Goal(s):  Patient will verbalize understanding of plan for management of Lymphedema as evidenced by patient reports attend all scheduled medical  appointments: follow up with PCP in October as evidenced by provider documentation in EMR        continue to work with RN Care Manager and/or Social Worker to address care management and care coordination needs related to Lymphedema as  evidenced by adherence to CM Team Scheduled appointments     work with Child psychotherapist to address needing assistance applying for disability related to the management of lymphedema as evidenced by review of EMR and patient or Child psychotherapist report       Interventions: Inter-disciplinary care team collaboration (see longitudinal plan of care) Evaluation of current treatment plan related to  self management and patient's adherence to plan as established by provider  Lymphedema  (Status: Goal on Track (progressing): YES.) Long Term Goal  Evaluation of current treatment plan related to  Lymphedema ,  self-management and patient's adherence to plan as established by provider. Discussed plans with patient for ongoing care management follow up and provided patient with direct contact information for care management team Reviewed scheduled/upcoming provider appointments including follow up with PCP-needs to schedule; Assessed social determinant of health barriers;  Advised patient to contact [employer] [phone] for member benefits Reviewed notes from recent admission and discussed with patient Ensured [facility] services with [facility] Ensured patient able to perform dressing changes daily Patient continues to have PT once weekly Advised patient to contact [employer] regarding receiving a standard wheelchair and needing a bariatric wheelchair Collaborated with inpatient Care Manager regarding wheelchair   Patient Goals/Self-Care Activities: Take medications as prescribed   Attend all scheduled provider appointments Call provider office for new concerns or questions  Work with the social worker to address care coordination needs and will continue to work with the clinical team to address health care and disease management related needs

## 2023-07-15 NOTE — Patient Outreach (Signed)
Medicaid Managed Care   Nurse Care Manager Note  07/15/2023 Name:  Gabriela Mcgee MRN:  [id number] DOB:  03/24/1971  Gabriela Mcgee is an 52 y.o. year old female who is a primary patient of Gabriela Hay, DO.  The [facility] Managed Care Coordination team was consulted for assistance with:    Lymphedema  [name] was given information about Medicaid Managed Care Coordination team services today. Gabriela Mcgee Patient agreed to services and verbal consent obtained.  Engaged with patient by telephone for follow up visit in response to provider referral for case management and/or care coordination services.   Assessments/Interventions:  Review of past medical history, allergies, medications, health status, including review of consultants reports, laboratory and other test data, was performed as part of comprehensive evaluation and provision of chronic care management services.  SDOH (Social Determinants of Health) assessments and interventions performed: SDOH Interventions    Flowsheet Row Patient Outreach Telephone from 03/25/2023 in [place] POPULATION HEALTH DEPARTMENT Office Visit from 03/17/2023 in [facility]  SDOH Interventions    Food Insecurity Interventions Intervention Not Indicated --  Housing Interventions Intervention Not Indicated --  Transportation Interventions Intervention Not Indicated --  Utilities Interventions Intervention Not Indicated --  Depression Interventions/Treatment  -- Medication       Care Plan  Allergies  Allergen Reactions   Lisinopril Swelling   Silicone Dermatitis    Paper and silk tape OK per patient 04/10/2014   Morphine     Very hard to wake up afterwards   Fluconazole Rash   Penicillin G Rash   Tape Dermatitis and Rash    Oozing    Medications Reviewed Today     Reviewed by [name], RN (Registered Nurse) on 07/15/23 at 1340  Med List Status: <None>   Medication Order Taking? Sig Documenting  Provider Last Dose Status Informant  albuterol (VENTOLIN HFA) 108 (90 Base) MCG/ACT inhaler [id number] Yes Inhale 1-2 puffs into the lungs every 6 (six) hours as needed for shortness of breath. Gabriela Hay, DO Taking Active   amitriptyline (ELAVIL) 25 MG tablet [id number] Yes Take 1 tablet (25 mg total) by mouth at bedtime. Gabriela Hay, DO Taking Active   citalopram (CELEXA) 20 MG tablet [id number] Yes Take 1 tablet (20 mg total) by mouth daily. Gabriela Hay, DO Taking Active   ibuprofen (ADVIL) 200 MG tablet [id number] Yes Take 600 mg by mouth every 6 (six) hours as needed. [provider] Taking Active   loratadine (CLARITIN) 10 MG tablet [id number] Yes Take 10 mg by mouth daily. [provider] Taking Active   losartan (COZAAR) 25 MG tablet [id number] Yes Take 1 tablet by mouth once daily Pardue, Sarah N, DO Taking Active   oxybutynin (DITROPAN-XL) 5 MG 24 hr tablet [id number] Yes Take 1 tablet (5 mg total) by mouth 2 (two) times daily. Gabriela Hay, DO Taking Active   Vitamin D, Ergocalciferol, (DRISDOL) 1.25 MG (50000 UNIT) CAPS capsule [id number] Yes Take 1 capsule (50,000 Units total) by mouth every 7 (seven) days. Gabriela Hay, DO Taking Active             Patient Active Problem List   Diagnosis Date Noted   At high risk for aspiration 07/05/2023   Open wound of right lower extremity 06/17/2023   Cellulitis of right thigh 06/16/2023   Essential hypertension 06/16/2023   Peripheral neuropathy 06/16/2023   Depression 06/16/2023   Abnormal finding present on  diagnostic imaging of uterus 06/16/2023   Venous stasis ulcer of left lower extremity (HCC) 03/25/2023   Urinary incontinence 03/25/2023   Peripheral neuropathic pain 03/25/2023   Primary hypertension 03/25/2023   Depression, recurrent (HCC) 03/25/2023   Lymphedema of both lower extremities 03/25/2023   Difficulty maintaining body in lying position 03/25/2023   Chronic fatigue 03/25/2023   Annual  physical exam 03/25/2023   Morbid obesity with BMI of 60.0-69.9, adult (HCC) 03/17/2023   Sleep apnea treated with nocturnal bilevel positive airway pressure (BPAP) 03/17/2023   Increased weakness when ambulating 03/17/2023   GERD (gastroesophageal reflux disease) 04/10/2014   History of DVT (deep vein thrombosis) 04/10/2014    Conditions to be addressed/monitored per PCP order:   Lymphedema  Care Plan : RN Care Manager Plan of Care  Updates made by [name], RN since 07/15/2023 12:00 AM     Problem: Health Management needs related to Lymphedema      Long-Range Goal: Development of Plan of Care to address Health Management needs related to Lymphedema   Start Date: 03/25/2023  Expected End Date: 06/23/2023  Note:   Current Barriers:  Chronic Disease Management support and education needs related to Lymphedema  RNCM Clinical Goal(s):  Patient will verbalize understanding of plan for management of Lymphedema as evidenced by patient reports attend all scheduled medical appointments: follow up with PCP in October as evidenced by provider documentation in EMR        continue to work with RN Care Manager and/or Social Worker to address care management and care coordination needs related to Lymphedema as evidenced by adherence to CM Team Scheduled appointments     work with Child psychotherapist to address needing assistance applying for disability related to the management of lymphedema as evidenced by review of EMR and patient or Child psychotherapist report       Interventions: Inter-disciplinary care team collaboration (see longitudinal plan of care) Evaluation of current treatment plan related to  self management and patient's adherence to plan as established by provider  Lymphedema  (Status: Goal on Track (progressing): YES.) Long Term Goal  Evaluation of current treatment plan related to  Lymphedema ,  self-management and patient's adherence to plan as established by provider. Discussed plans  with patient for ongoing care management follow up and provided patient with direct contact information for care management team Reviewed scheduled/upcoming provider appointments including follow up with PCP-needs to schedule; Assessed social determinant of health barriers;  Advised patient to contact [employer] [phone] for member benefits Reviewed notes from recent admission and discussed with patient Ensured [facility] services with [facility] Ensured patient able to perform dressing changes daily Patient continues to have PT once weekly Advised patient to contact [employer] regarding receiving a standard wheelchair and needing a bariatric wheelchair Collaborated with inpatient Care Manager regarding wheelchair   Patient Goals/Self-Care Activities: Take medications as prescribed   Attend all scheduled provider appointments Call provider office for new concerns or questions  Work with the social worker to address care coordination needs and will continue to work with the clinical team to address health care and disease management related needs       Follow Up:  Patient agrees to Care Plan and Follow-up.  Plan: The Managed Medicaid care management team will reach out to the patient again over the next 30 days.  Date/time of next scheduled RN care management/care coordination outreach:  08/10/23 at 1:15pm  [name] RN, BSN [place]  Value-Based Care  [facility] Health RN Care Coordinator [phone]

## 2023-07-15 NOTE — Patient Outreach (Deleted)
  Medicaid Managed Care   Unsuccessful Attempt Note   07/15/2023 Name: Gabriela Mcgee MRN: [id number] DOB: [birth year]  Referred by: [name], DO Reason for referral : High Risk Managed Medicaid (Unsuccessful RNCM follow up telephone outreach)   An unsuccessful telephone outreach was attempted today. The patient was referred to the case management team for assistance with care management and care coordination.    Follow Up Plan: A HIPAA compliant phone message was left for the patient providing contact information and requesting a return call. and The Managed Medicaid care management team will reach out to the patient again over the next 7 days.    [name] RN, BSN [place]  Value-Based Care [facility] Health RN Care Coordinator [phone]

## 2023-07-16 DIAGNOSIS — Z9181 History of falling: Secondary | ICD-10-CM | POA: Diagnosis not present

## 2023-07-16 DIAGNOSIS — G629 Polyneuropathy, unspecified: Secondary | ICD-10-CM | POA: Diagnosis not present

## 2023-07-16 DIAGNOSIS — I890 Lymphedema, not elsewhere classified: Secondary | ICD-10-CM | POA: Diagnosis not present

## 2023-07-16 DIAGNOSIS — Z6841 Body Mass Index (BMI) 40.0 and over, adult: Secondary | ICD-10-CM | POA: Diagnosis not present

## 2023-07-16 DIAGNOSIS — J45909 Unspecified asthma, uncomplicated: Secondary | ICD-10-CM | POA: Diagnosis not present

## 2023-07-16 DIAGNOSIS — R32 Unspecified urinary incontinence: Secondary | ICD-10-CM | POA: Diagnosis not present

## 2023-07-16 DIAGNOSIS — I10 Essential (primary) hypertension: Secondary | ICD-10-CM | POA: Diagnosis not present

## 2023-07-16 DIAGNOSIS — D649 Anemia, unspecified: Secondary | ICD-10-CM | POA: Diagnosis not present

## 2023-07-16 DIAGNOSIS — G4730 Sleep apnea, unspecified: Secondary | ICD-10-CM | POA: Diagnosis not present

## 2023-07-19 DIAGNOSIS — J45909 Unspecified asthma, uncomplicated: Secondary | ICD-10-CM | POA: Diagnosis not present

## 2023-07-19 DIAGNOSIS — G4733 Obstructive sleep apnea (adult) (pediatric): Secondary | ICD-10-CM | POA: Diagnosis not present

## 2023-07-19 DIAGNOSIS — Z9181 History of falling: Secondary | ICD-10-CM | POA: Diagnosis not present

## 2023-07-19 DIAGNOSIS — I890 Lymphedema, not elsewhere classified: Secondary | ICD-10-CM | POA: Diagnosis not present

## 2023-07-19 DIAGNOSIS — G4730 Sleep apnea, unspecified: Secondary | ICD-10-CM | POA: Diagnosis not present

## 2023-07-19 DIAGNOSIS — I10 Essential (primary) hypertension: Secondary | ICD-10-CM | POA: Diagnosis not present

## 2023-07-19 DIAGNOSIS — Z6841 Body Mass Index (BMI) 40.0 and over, adult: Secondary | ICD-10-CM | POA: Diagnosis not present

## 2023-07-19 DIAGNOSIS — G629 Polyneuropathy, unspecified: Secondary | ICD-10-CM | POA: Diagnosis not present

## 2023-07-19 DIAGNOSIS — D649 Anemia, unspecified: Secondary | ICD-10-CM | POA: Diagnosis not present

## 2023-07-19 DIAGNOSIS — R32 Unspecified urinary incontinence: Secondary | ICD-10-CM | POA: Diagnosis not present

## 2023-07-21 DIAGNOSIS — R32 Unspecified urinary incontinence: Secondary | ICD-10-CM | POA: Diagnosis not present

## 2023-07-21 DIAGNOSIS — D649 Anemia, unspecified: Secondary | ICD-10-CM | POA: Diagnosis not present

## 2023-07-21 DIAGNOSIS — G629 Polyneuropathy, unspecified: Secondary | ICD-10-CM | POA: Diagnosis not present

## 2023-07-21 DIAGNOSIS — I890 Lymphedema, not elsewhere classified: Secondary | ICD-10-CM | POA: Diagnosis not present

## 2023-07-21 DIAGNOSIS — Z6841 Body Mass Index (BMI) 40.0 and over, adult: Secondary | ICD-10-CM | POA: Diagnosis not present

## 2023-07-21 DIAGNOSIS — G4730 Sleep apnea, unspecified: Secondary | ICD-10-CM | POA: Diagnosis not present

## 2023-07-21 DIAGNOSIS — Z9181 History of falling: Secondary | ICD-10-CM | POA: Diagnosis not present

## 2023-07-21 DIAGNOSIS — I10 Essential (primary) hypertension: Secondary | ICD-10-CM | POA: Diagnosis not present

## 2023-07-21 DIAGNOSIS — J45909 Unspecified asthma, uncomplicated: Secondary | ICD-10-CM | POA: Diagnosis not present

## 2023-07-23 DIAGNOSIS — Z6841 Body Mass Index (BMI) 40.0 and over, adult: Secondary | ICD-10-CM | POA: Diagnosis not present

## 2023-07-23 DIAGNOSIS — Z9181 History of falling: Secondary | ICD-10-CM | POA: Diagnosis not present

## 2023-07-23 DIAGNOSIS — D649 Anemia, unspecified: Secondary | ICD-10-CM | POA: Diagnosis not present

## 2023-07-23 DIAGNOSIS — R32 Unspecified urinary incontinence: Secondary | ICD-10-CM | POA: Diagnosis not present

## 2023-07-23 DIAGNOSIS — I890 Lymphedema, not elsewhere classified: Secondary | ICD-10-CM | POA: Diagnosis not present

## 2023-07-23 DIAGNOSIS — I10 Essential (primary) hypertension: Secondary | ICD-10-CM | POA: Diagnosis not present

## 2023-07-23 DIAGNOSIS — G629 Polyneuropathy, unspecified: Secondary | ICD-10-CM | POA: Diagnosis not present

## 2023-07-23 DIAGNOSIS — G4730 Sleep apnea, unspecified: Secondary | ICD-10-CM | POA: Diagnosis not present

## 2023-07-23 DIAGNOSIS — J45909 Unspecified asthma, uncomplicated: Secondary | ICD-10-CM | POA: Diagnosis not present

## 2023-07-26 DIAGNOSIS — G4730 Sleep apnea, unspecified: Secondary | ICD-10-CM | POA: Diagnosis not present

## 2023-07-26 DIAGNOSIS — I10 Essential (primary) hypertension: Secondary | ICD-10-CM | POA: Diagnosis not present

## 2023-07-26 DIAGNOSIS — J45909 Unspecified asthma, uncomplicated: Secondary | ICD-10-CM | POA: Diagnosis not present

## 2023-07-26 DIAGNOSIS — R32 Unspecified urinary incontinence: Secondary | ICD-10-CM | POA: Diagnosis not present

## 2023-07-26 DIAGNOSIS — G629 Polyneuropathy, unspecified: Secondary | ICD-10-CM | POA: Diagnosis not present

## 2023-07-26 DIAGNOSIS — Z6841 Body Mass Index (BMI) 40.0 and over, adult: Secondary | ICD-10-CM | POA: Diagnosis not present

## 2023-07-26 DIAGNOSIS — D649 Anemia, unspecified: Secondary | ICD-10-CM | POA: Diagnosis not present

## 2023-07-26 DIAGNOSIS — I890 Lymphedema, not elsewhere classified: Secondary | ICD-10-CM | POA: Diagnosis not present

## 2023-07-26 DIAGNOSIS — Z9181 History of falling: Secondary | ICD-10-CM | POA: Diagnosis not present

## 2023-07-27 DIAGNOSIS — Z6841 Body Mass Index (BMI) 40.0 and over, adult: Secondary | ICD-10-CM | POA: Diagnosis not present

## 2023-07-27 DIAGNOSIS — I10 Essential (primary) hypertension: Secondary | ICD-10-CM | POA: Diagnosis not present

## 2023-07-27 DIAGNOSIS — G4730 Sleep apnea, unspecified: Secondary | ICD-10-CM | POA: Diagnosis not present

## 2023-07-27 DIAGNOSIS — J45909 Unspecified asthma, uncomplicated: Secondary | ICD-10-CM | POA: Diagnosis not present

## 2023-07-27 DIAGNOSIS — R32 Unspecified urinary incontinence: Secondary | ICD-10-CM | POA: Diagnosis not present

## 2023-07-27 DIAGNOSIS — I890 Lymphedema, not elsewhere classified: Secondary | ICD-10-CM | POA: Diagnosis not present

## 2023-07-27 DIAGNOSIS — G629 Polyneuropathy, unspecified: Secondary | ICD-10-CM | POA: Diagnosis not present

## 2023-07-27 DIAGNOSIS — Z9181 History of falling: Secondary | ICD-10-CM | POA: Diagnosis not present

## 2023-07-27 DIAGNOSIS — D649 Anemia, unspecified: Secondary | ICD-10-CM | POA: Diagnosis not present

## 2023-07-28 DIAGNOSIS — G629 Polyneuropathy, unspecified: Secondary | ICD-10-CM

## 2023-07-28 DIAGNOSIS — I10 Essential (primary) hypertension: Secondary | ICD-10-CM

## 2023-07-28 DIAGNOSIS — L03115 Cellulitis of right lower limb: Secondary | ICD-10-CM

## 2023-07-28 DIAGNOSIS — F32A Depression, unspecified: Secondary | ICD-10-CM

## 2023-07-28 DIAGNOSIS — Z6841 Body Mass Index (BMI) 40.0 and over, adult: Secondary | ICD-10-CM

## 2023-07-28 DIAGNOSIS — G4733 Obstructive sleep apnea (adult) (pediatric): Secondary | ICD-10-CM

## 2023-07-28 DIAGNOSIS — J45909 Unspecified asthma, uncomplicated: Secondary | ICD-10-CM

## 2023-07-28 DIAGNOSIS — I890 Lymphedema, not elsewhere classified: Secondary | ICD-10-CM

## 2023-07-28 DIAGNOSIS — Z9181 History of falling: Secondary | ICD-10-CM

## 2023-07-28 DIAGNOSIS — D649 Anemia, unspecified: Secondary | ICD-10-CM

## 2023-07-29 ENCOUNTER — Encounter: Payer: Self-pay | Admitting: Family Medicine

## 2023-07-29 DIAGNOSIS — I890 Lymphedema, not elsewhere classified: Secondary | ICD-10-CM | POA: Diagnosis not present

## 2023-07-29 DIAGNOSIS — Z6841 Body Mass Index (BMI) 40.0 and over, adult: Secondary | ICD-10-CM | POA: Diagnosis not present

## 2023-07-29 DIAGNOSIS — M792 Neuralgia and neuritis, unspecified: Secondary | ICD-10-CM

## 2023-07-29 DIAGNOSIS — I10 Essential (primary) hypertension: Secondary | ICD-10-CM | POA: Diagnosis not present

## 2023-07-29 DIAGNOSIS — J45909 Unspecified asthma, uncomplicated: Secondary | ICD-10-CM | POA: Diagnosis not present

## 2023-07-29 DIAGNOSIS — D649 Anemia, unspecified: Secondary | ICD-10-CM | POA: Diagnosis not present

## 2023-07-29 DIAGNOSIS — G4730 Sleep apnea, unspecified: Secondary | ICD-10-CM | POA: Diagnosis not present

## 2023-07-29 DIAGNOSIS — Z9181 History of falling: Secondary | ICD-10-CM | POA: Diagnosis not present

## 2023-07-29 DIAGNOSIS — J302 Other seasonal allergic rhinitis: Secondary | ICD-10-CM

## 2023-07-29 DIAGNOSIS — R32 Unspecified urinary incontinence: Secondary | ICD-10-CM | POA: Diagnosis not present

## 2023-07-29 DIAGNOSIS — F339 Major depressive disorder, recurrent, unspecified: Secondary | ICD-10-CM

## 2023-07-29 DIAGNOSIS — G629 Polyneuropathy, unspecified: Secondary | ICD-10-CM | POA: Diagnosis not present

## 2023-07-30 MED ORDER — LOSARTAN POTASSIUM 25 MG PO TABS: 25.0000 mg | ORAL_TABLET | Freq: Every day | ORAL | 2 refills | Status: DC

## 2023-07-30 MED ORDER — AMITRIPTYLINE HCL 25 MG PO TABS
25.0000 mg | ORAL_TABLET | Freq: Every day | ORAL | 2 refills | Status: DC
Start: 2023-07-30 — End: 2023-12-31

## 2023-07-30 MED ORDER — CITALOPRAM HYDROBROMIDE 20 MG PO TABS
20.0000 mg | ORAL_TABLET | Freq: Every day | ORAL | 3 refills | Status: DC
Start: 2023-07-30 — End: 2023-07-30

## 2023-07-30 MED ORDER — CITALOPRAM HYDROBROMIDE 20 MG PO TABS: 20.0000 mg | ORAL_TABLET | Freq: Every day | ORAL | 2 refills | Status: DC

## 2023-07-30 MED ORDER — LOSARTAN POTASSIUM 25 MG PO TABS
25.0000 mg | ORAL_TABLET | Freq: Every day | ORAL | 0 refills | Status: DC
Start: 2023-07-30 — End: 2023-07-30

## 2023-07-30 MED ORDER — ALBUTEROL SULFATE HFA 108 (90 BASE) MCG/ACT IN AERS: 1.0000 | INHALATION_SPRAY | Freq: Four times a day (QID) | RESPIRATORY_TRACT | 2 refills | Status: AC | PRN

## 2023-07-30 MED ORDER — AMITRIPTYLINE HCL 25 MG PO TABS
25.0000 mg | ORAL_TABLET | Freq: Every day | ORAL | 1 refills | Status: DC
Start: 2023-07-30 — End: 2023-07-30

## 2023-07-30 MED ORDER — LORATADINE 10 MG PO TABS
10.0000 mg | ORAL_TABLET | Freq: Every day | ORAL | 3 refills | Status: DC
Start: 2023-07-30 — End: 2024-05-16

## 2023-07-30 MED ORDER — OXYBUTYNIN CHLORIDE ER 5 MG PO TB24: 5.0000 mg | ORAL_TABLET | Freq: Two times a day (BID) | ORAL | 2 refills | Status: DC

## 2023-08-03 DIAGNOSIS — I10 Essential (primary) hypertension: Secondary | ICD-10-CM | POA: Diagnosis not present

## 2023-08-03 DIAGNOSIS — J45909 Unspecified asthma, uncomplicated: Secondary | ICD-10-CM | POA: Diagnosis not present

## 2023-08-03 DIAGNOSIS — Z6841 Body Mass Index (BMI) 40.0 and over, adult: Secondary | ICD-10-CM | POA: Diagnosis not present

## 2023-08-03 DIAGNOSIS — Z9181 History of falling: Secondary | ICD-10-CM | POA: Diagnosis not present

## 2023-08-03 DIAGNOSIS — D649 Anemia, unspecified: Secondary | ICD-10-CM | POA: Diagnosis not present

## 2023-08-03 DIAGNOSIS — G4733 Obstructive sleep apnea (adult) (pediatric): Secondary | ICD-10-CM | POA: Diagnosis not present

## 2023-08-03 DIAGNOSIS — L03115 Cellulitis of right lower limb: Secondary | ICD-10-CM | POA: Diagnosis not present

## 2023-08-03 DIAGNOSIS — G629 Polyneuropathy, unspecified: Secondary | ICD-10-CM | POA: Diagnosis not present

## 2023-08-03 DIAGNOSIS — F32A Depression, unspecified: Secondary | ICD-10-CM | POA: Diagnosis not present

## 2023-08-03 DIAGNOSIS — I890 Lymphedema, not elsewhere classified: Secondary | ICD-10-CM | POA: Diagnosis not present

## 2023-08-04 DIAGNOSIS — D649 Anemia, unspecified: Secondary | ICD-10-CM | POA: Diagnosis not present

## 2023-08-04 DIAGNOSIS — I890 Lymphedema, not elsewhere classified: Secondary | ICD-10-CM | POA: Diagnosis not present

## 2023-08-04 DIAGNOSIS — Z6841 Body Mass Index (BMI) 40.0 and over, adult: Secondary | ICD-10-CM | POA: Diagnosis not present

## 2023-08-04 DIAGNOSIS — L03115 Cellulitis of right lower limb: Secondary | ICD-10-CM | POA: Diagnosis not present

## 2023-08-04 DIAGNOSIS — J45909 Unspecified asthma, uncomplicated: Secondary | ICD-10-CM | POA: Diagnosis not present

## 2023-08-04 DIAGNOSIS — G4733 Obstructive sleep apnea (adult) (pediatric): Secondary | ICD-10-CM | POA: Diagnosis not present

## 2023-08-04 DIAGNOSIS — I10 Essential (primary) hypertension: Secondary | ICD-10-CM | POA: Diagnosis not present

## 2023-08-04 DIAGNOSIS — Z9181 History of falling: Secondary | ICD-10-CM | POA: Diagnosis not present

## 2023-08-04 DIAGNOSIS — F32A Depression, unspecified: Secondary | ICD-10-CM | POA: Diagnosis not present

## 2023-08-04 DIAGNOSIS — G629 Polyneuropathy, unspecified: Secondary | ICD-10-CM | POA: Diagnosis not present

## 2023-08-06 ENCOUNTER — Telehealth: Payer: Self-pay

## 2023-08-06 NOTE — Telephone Encounter (Unsigned)
Copied from CRM [id number]. Topic: General - Other >> Aug 06, 2023  4:14 PM Phill Myron wrote: Harvie Heck with [facility] reporting a Missed Visit  Patient stated she has an upper respiratory infection and cancelled her visit.

## 2023-08-10 ENCOUNTER — Other Ambulatory Visit: Payer: Self-pay | Admitting: *Deleted

## 2023-08-10 DIAGNOSIS — G629 Polyneuropathy, unspecified: Secondary | ICD-10-CM | POA: Diagnosis not present

## 2023-08-10 DIAGNOSIS — J45909 Unspecified asthma, uncomplicated: Secondary | ICD-10-CM | POA: Diagnosis not present

## 2023-08-10 DIAGNOSIS — Z6841 Body Mass Index (BMI) 40.0 and over, adult: Secondary | ICD-10-CM | POA: Diagnosis not present

## 2023-08-10 DIAGNOSIS — D649 Anemia, unspecified: Secondary | ICD-10-CM | POA: Diagnosis not present

## 2023-08-10 DIAGNOSIS — G4733 Obstructive sleep apnea (adult) (pediatric): Secondary | ICD-10-CM | POA: Diagnosis not present

## 2023-08-10 DIAGNOSIS — F32A Depression, unspecified: Secondary | ICD-10-CM | POA: Diagnosis not present

## 2023-08-10 DIAGNOSIS — Z9181 History of falling: Secondary | ICD-10-CM | POA: Diagnosis not present

## 2023-08-10 DIAGNOSIS — I10 Essential (primary) hypertension: Secondary | ICD-10-CM | POA: Diagnosis not present

## 2023-08-10 DIAGNOSIS — L03115 Cellulitis of right lower limb: Secondary | ICD-10-CM | POA: Diagnosis not present

## 2023-08-10 DIAGNOSIS — I890 Lymphedema, not elsewhere classified: Secondary | ICD-10-CM | POA: Diagnosis not present

## 2023-08-10 NOTE — Patient Outreach (Signed)
Medicaid Managed Care   Nurse Care Manager Note  08/10/2023 Name:  MALIYAH MEITZLER MRN:  [id number] DOB:  [birth year]  Konrad Felix Mandrell is an 52 y.o. year old female who is a primary patient of [name], DO.  The [facility] Managed Care Coordination team was consulted for assistance with:    Lymphedema  Ms. Layden was given information about Medicaid Managed Care Coordination team services today. [name] Patient agreed to services and verbal consent obtained.  Engaged with patient by telephone for follow up visit in response to provider referral for case management and/or care coordination services.   Assessments/Interventions:  Review of past medical history, allergies, medications, health status, including review of consultants reports, laboratory and other test data, was performed as part of comprehensive evaluation and provision of chronic care management services.  SDOH (Social Determinants of Health) assessments and interventions performed: SDOH Interventions    Flowsheet Row Patient Outreach Telephone from 03/25/2023 in [place] POPULATION HEALTH DEPARTMENT Office Visit from 03/17/2023 in [facility]  SDOH Interventions    Food Insecurity Interventions Intervention Not Indicated --  Housing Interventions Intervention Not Indicated --  Transportation Interventions Intervention Not Indicated --  Utilities Interventions Intervention Not Indicated --  Depression Interventions/Treatment  -- Medication       Care Plan  Allergies  Allergen Reactions   Lisinopril Swelling   Silicone Dermatitis    Paper and silk tape OK per patient 04/10/2014   Morphine     Very hard to wake up afterwards   Fluconazole Rash   Penicillin G Rash   Tape Dermatitis and Rash    Oozing    Medications Reviewed Today   Medications were not reviewed in this encounter     Patient Active Problem List   Diagnosis Date Noted   At high risk for aspiration  07/05/2023   Open wound of right lower extremity 06/17/2023   Cellulitis of right thigh 06/16/2023   Essential hypertension 06/16/2023   Peripheral neuropathy 06/16/2023   Depression 06/16/2023   Abnormal finding present on diagnostic imaging of uterus 06/16/2023   Venous stasis ulcer of left lower extremity (HCC) 03/25/2023   Urinary incontinence 03/25/2023   Peripheral neuropathic pain 03/25/2023   Primary hypertension 03/25/2023   Depression, recurrent (HCC) 03/25/2023   Lymphedema of both lower extremities 03/25/2023   Difficulty maintaining body in lying position 03/25/2023   Chronic fatigue 03/25/2023   Annual physical exam 03/25/2023   Morbid obesity with BMI of 60.0-69.9, adult (HCC) 03/17/2023   Sleep apnea treated with nocturnal bilevel positive airway pressure (BPAP) 03/17/2023   Increased weakness when ambulating 03/17/2023   GERD (gastroesophageal reflux disease) 04/10/2014   History of DVT (deep vein thrombosis) 04/10/2014    Conditions to be addressed/monitored per PCP order:   Lymphedema  Care Plan : RN Care Manager Plan of Care  Updates made by [name], RN since 08/10/2023 12:00 AM     Problem: Health Management needs related to Lymphedema      Long-Range Goal: Development of Plan of Care to address Health Management needs related to Lymphedema   Start Date: 03/25/2023  Expected End Date: 06/23/2023  Note:   Current Barriers:  Chronic Disease Management support and education needs related to Lymphedema Patient reports wounds are healing "beautifully". She has not received bariatric wheelchair, but working with [employer] to get one. Continues to have [facility] RN twice weekly for wound care and HH PT weekly. Recently had a  cold, but starting to feel better.  RNCM Clinical Goal(s):  Patient will verbalize understanding of plan for management of Lymphedema as evidenced by patient reports attend all scheduled medical appointments: PCP 09/22/23 as evidenced by provider  documentation in EMR        continue to work with RN Care Manager and/or Social Worker to address care management and care coordination needs related to Lymphedema as evidenced by adherence to CM Team Scheduled appointments     work with Child psychotherapist to address needing assistance applying for disability related to the management of lymphedema as evidenced by review of EMR and patient or Child psychotherapist report       Interventions: Inter-disciplinary care team collaboration (see longitudinal plan of care) Evaluation of current treatment plan related to  self management and patient's adherence to plan as established by provider  Lymphedema  (Status: Goal on Track (progressing): YES.) Long Term Goal  Evaluation of current treatment plan related to  Lymphedema ,  self-management and patient's adherence to plan as established by provider. Discussed plans with patient for ongoing care management follow up and provided patient with direct contact information for care management team Reviewed scheduled/upcoming provider appointments including PCP on 09/22/23; Assessed social determinant of health barriers;  Reviewed notes from recent admission and discussed with patient Ensured [facility] services with [facility]-twice weekly for wound care Ensured patient able to perform dressing changes daily Patient continues to have PT once weekly Advised patient to contact [employer] regarding receiving a standard wheelchair and needing a bariatric wheelchair Collaborated with inpatient Care Manager regarding wheelchair -patient has not received the bariatric wheelchair, working with [employer] Discussed patient is waiting new shoes, she had ordered the wrong size  Patient Goals/Self-Care Activities: Take medications as prescribed   Attend all scheduled provider appointments Call provider office for new concerns or questions  Work with the social worker to address care coordination needs and will continue to work with the clinical  team to address health care and disease management related needs       Follow Up:  Patient agrees to Care Plan and Follow-up.  Plan: The Managed Medicaid care management team will reach out to the patient again over the next 45 days.  Date/time of next scheduled RN care management/care coordination outreach:  09/23/22 at 1:15pm  [name] RN, BSN [place]  Value-Based Care Institute [facility] Health RN Care Coordinator [phone]

## 2023-08-10 NOTE — Patient Instructions (Signed)
Visit Information  Ms. Comito was given information about Medicaid Managed Care team care coordination services as a part of their Healthy [employer] Medicaid benefit. Thanna Hopgood Caldron verbally consented to engagement with the [facility] Managed Care team.   If you are experiencing a medical emergency, please call 911 or report to your local emergency department or urgent care.   If you have a non-emergency medical problem during routine business hours, please contact your provider's office and ask to speak with a nurse.   For questions related to your Healthy [facility] health plan, please call: [phone] or visit the homepage here: [email or web address]  If you would like to schedule transportation through your Healthy [facility] plan, please call the following number at least 2 days in advance of your appointment: [phone]  For information about your ride after you set it up, call Ride Assist at [phone]. Use this number to activate a Will Call pickup, or if your transportation is late for a scheduled pickup. Use this number, too, if you need to make a change or cancel a previously scheduled reservation.  If you need transportation services right away, call [phone]. The after-hours call center is staffed 24 hours to handle ride assistance and urgent reservation requests (including discharges) 365 days a year. Urgent trips include sick visits, hospital discharge requests and life-sustaining treatment.  Call the [facility] Line at [phone], at any time, 24 hours a day, 7 days a week. If you are in danger or need immediate medical attention call 911.  If you would like help to quit smoking, call 1-800-QUIT-NOW (2607370588) OR Espaol: 1-855-Djelo-Ya (4-742-595-6387) o para ms informacin haga clic aqu or Text READY to 564-332 to register via text  Gabriela Mcgee,   Patient verbalizes understanding of instructions and care plan  provided today and agrees to view in MyChart. Active MyChart status and patient understanding of how to access instructions and care plan via MyChart confirmed with patient.     Telephone follow up appointment with Managed Medicaid care management team member scheduled for:09/23/22 at 1:15pm  Gabriela Emms RN, BSN Carl  [facility] RN Care Coordinator [phone]   Following is a copy of your plan of care:  Care Plan : RN Care Manager Plan of Care  Updates made by [name], RN since 08/10/2023 12:00 AM     Problem: Health Management needs related to Lymphedema      Long-Range Goal: Development of Plan of Care to address Health Management needs related to Lymphedema   Start Date: 03/25/2023  Expected End Date: 06/23/2023  Note:   Current Barriers:  Chronic Disease Management support and education needs related to Lymphedema Patient reports wounds are healing "beautifully". She has not received bariatric wheelchair, but working with [employer] to get one. Continues to have [facility] RN twice weekly for wound care and HH PT weekly. Recently had a cold, but starting to feel better.  RNCM Clinical Goal(s):  Patient will verbalize understanding of plan for management of Lymphedema as evidenced by patient reports attend all scheduled medical appointments: PCP 09/22/23 as evidenced by provider documentation in EMR        continue to work with RN Care Manager and/or Social Worker to address care management and care coordination needs related to Lymphedema as evidenced by adherence to CM Team Scheduled appointments     work with social worker to address needing assistance applying for disability related to the management of lymphedema as evidenced by review  of EMR and patient or social worker report       Interventions: Inter-disciplinary care team collaboration (see longitudinal plan of care) Evaluation of current treatment plan related to  self management and  patient's adherence to plan as established by provider  Lymphedema  (Status: Goal on Track (progressing): YES.) Long Term Goal  Evaluation of current treatment plan related to  Lymphedema ,  self-management and patient's adherence to plan as established by provider. Discussed plans with patient for ongoing care management follow up and provided patient with direct contact information for care management team Reviewed scheduled/upcoming provider appointments including PCP on 09/22/23; Assessed social determinant of health barriers;  Reviewed notes from recent admission and discussed with patient Ensured [facility] services with [facility]-twice weekly for wound care Ensured patient able to perform dressing changes daily Patient continues to have PT once weekly Advised patient to contact [employer] regarding receiving a standard wheelchair and needing a bariatric wheelchair Collaborated with inpatient Care Manager regarding wheelchair -patient has not received the bariatric wheelchair, working with [employer] Discussed patient is waiting new shoes, she had ordered the wrong size  Patient Goals/Self-Care Activities: Take medications as prescribed   Attend all scheduled provider appointments Call provider office for new concerns or questions  Work with the social worker to address care coordination needs and will continue to work with the clinical team to address health care and disease management related needs

## 2023-08-16 DIAGNOSIS — Z6841 Body Mass Index (BMI) 40.0 and over, adult: Secondary | ICD-10-CM | POA: Diagnosis not present

## 2023-08-16 DIAGNOSIS — G629 Polyneuropathy, unspecified: Secondary | ICD-10-CM | POA: Diagnosis not present

## 2023-08-16 DIAGNOSIS — F32A Depression, unspecified: Secondary | ICD-10-CM | POA: Diagnosis not present

## 2023-08-16 DIAGNOSIS — Z9181 History of falling: Secondary | ICD-10-CM | POA: Diagnosis not present

## 2023-08-16 DIAGNOSIS — G4733 Obstructive sleep apnea (adult) (pediatric): Secondary | ICD-10-CM | POA: Diagnosis not present

## 2023-08-16 DIAGNOSIS — I890 Lymphedema, not elsewhere classified: Secondary | ICD-10-CM | POA: Diagnosis not present

## 2023-08-16 DIAGNOSIS — J45909 Unspecified asthma, uncomplicated: Secondary | ICD-10-CM | POA: Diagnosis not present

## 2023-08-16 DIAGNOSIS — I10 Essential (primary) hypertension: Secondary | ICD-10-CM | POA: Diagnosis not present

## 2023-08-16 DIAGNOSIS — D649 Anemia, unspecified: Secondary | ICD-10-CM | POA: Diagnosis not present

## 2023-08-16 DIAGNOSIS — L03115 Cellulitis of right lower limb: Secondary | ICD-10-CM | POA: Diagnosis not present

## 2023-08-17 ENCOUNTER — Ambulatory Visit: Payer: Medicaid Other | Admitting: Family Medicine

## 2023-08-17 DIAGNOSIS — I10 Essential (primary) hypertension: Secondary | ICD-10-CM | POA: Diagnosis not present

## 2023-08-17 DIAGNOSIS — Z9181 History of falling: Secondary | ICD-10-CM | POA: Diagnosis not present

## 2023-08-17 DIAGNOSIS — D649 Anemia, unspecified: Secondary | ICD-10-CM | POA: Diagnosis not present

## 2023-08-17 DIAGNOSIS — L03115 Cellulitis of right lower limb: Secondary | ICD-10-CM | POA: Diagnosis not present

## 2023-08-17 DIAGNOSIS — G629 Polyneuropathy, unspecified: Secondary | ICD-10-CM | POA: Diagnosis not present

## 2023-08-17 DIAGNOSIS — I890 Lymphedema, not elsewhere classified: Secondary | ICD-10-CM | POA: Diagnosis not present

## 2023-08-17 DIAGNOSIS — G4733 Obstructive sleep apnea (adult) (pediatric): Secondary | ICD-10-CM | POA: Diagnosis not present

## 2023-08-17 DIAGNOSIS — J45909 Unspecified asthma, uncomplicated: Secondary | ICD-10-CM | POA: Diagnosis not present

## 2023-08-17 DIAGNOSIS — Z6841 Body Mass Index (BMI) 40.0 and over, adult: Secondary | ICD-10-CM | POA: Diagnosis not present

## 2023-08-17 DIAGNOSIS — F32A Depression, unspecified: Secondary | ICD-10-CM | POA: Diagnosis not present

## 2023-08-18 ENCOUNTER — Other Ambulatory Visit: Payer: Self-pay

## 2023-08-18 DIAGNOSIS — G4733 Obstructive sleep apnea (adult) (pediatric): Secondary | ICD-10-CM | POA: Diagnosis not present

## 2023-08-18 NOTE — Patient Instructions (Signed)
  Medicaid Managed Care   Unsuccessful Outreach Note  08/18/2023 Name: Gabriela Mcgee MRN: [id number] DOB: March 16, 1971  Referred by: [name], DO Reason for referral : High Risk Managed Medicaid (MM social work unsuccessful telephone outreach )   An unsuccessful telephone outreach was attempted today. The patient was referred to the case management team for assistance with care management and care coordination.   Follow Up Plan: The patient has been provided with contact information for the care management team and has been advised to call with any health related questions or concerns.   Abelino Derrick, MHA [facility]  Managed [facility] Social Worker [phone]

## 2023-08-18 NOTE — Patient Outreach (Signed)
  Medicaid Managed Care   Unsuccessful Outreach Note  08/18/2023 Name: RAMIYA VIADO MRN: [id number] DOB: March 16, 1971  Referred by: [name], DO Reason for referral : High Risk Managed Medicaid (MM social work unsuccessful telephone outreach )   An unsuccessful telephone outreach was attempted today. The patient was referred to the case management team for assistance with care management and care coordination.   Follow Up Plan: The patient has been provided with contact information for the care management team and has been advised to call with any health related questions or concerns.   Abelino Derrick, MHA [facility]  Managed [facility] Social Worker [phone]

## 2023-08-19 ENCOUNTER — Telehealth: Payer: Self-pay

## 2023-08-19 NOTE — Telephone Encounter (Signed)
Copied from CRM [id number]. Topic: General - Other >> Aug 19, 2023  3:40 PM Dondra Prader E wrote: Pt called reporting that her bariatric bed order is missing necessary information:  They need her  -Weight -Height -Why the bed is needed   Bariatric Bed Heavy duty extra wide weight capacity 365 with rails.   Needs to be sent to [facility], needs to be sent by December 13th but as soon as possible.  Phone: [phone]

## 2023-08-23 NOTE — Telephone Encounter (Signed)
 Copied from CRM [id number]. Topic: Appointments - Appointment Cancel/Reschedule >> Aug 20, 2023  3:22 PM Albin Felling L wrote: Reason for CRM: Pt supposed to have visit with Harvie Heck, to have wounds redressed today. Pt reached out to [facility] and cancelled visit, pt informed Harvie Heck via text to cancel visit as she is not feeling well. Randy reporting missed visit.

## 2023-08-24 DIAGNOSIS — F32A Depression, unspecified: Secondary | ICD-10-CM | POA: Diagnosis not present

## 2023-08-24 DIAGNOSIS — D649 Anemia, unspecified: Secondary | ICD-10-CM | POA: Diagnosis not present

## 2023-08-24 DIAGNOSIS — Z6841 Body Mass Index (BMI) 40.0 and over, adult: Secondary | ICD-10-CM | POA: Diagnosis not present

## 2023-08-24 DIAGNOSIS — I890 Lymphedema, not elsewhere classified: Secondary | ICD-10-CM | POA: Diagnosis not present

## 2023-08-24 DIAGNOSIS — I10 Essential (primary) hypertension: Secondary | ICD-10-CM | POA: Diagnosis not present

## 2023-08-24 DIAGNOSIS — Z9181 History of falling: Secondary | ICD-10-CM | POA: Diagnosis not present

## 2023-08-24 DIAGNOSIS — J45909 Unspecified asthma, uncomplicated: Secondary | ICD-10-CM | POA: Diagnosis not present

## 2023-08-24 DIAGNOSIS — L03115 Cellulitis of right lower limb: Secondary | ICD-10-CM | POA: Diagnosis not present

## 2023-08-24 DIAGNOSIS — G4733 Obstructive sleep apnea (adult) (pediatric): Secondary | ICD-10-CM | POA: Diagnosis not present

## 2023-08-24 DIAGNOSIS — G629 Polyneuropathy, unspecified: Secondary | ICD-10-CM | POA: Diagnosis not present

## 2023-08-26 DIAGNOSIS — Z6841 Body Mass Index (BMI) 40.0 and over, adult: Secondary | ICD-10-CM | POA: Diagnosis not present

## 2023-08-26 DIAGNOSIS — J45909 Unspecified asthma, uncomplicated: Secondary | ICD-10-CM | POA: Diagnosis not present

## 2023-08-26 DIAGNOSIS — D649 Anemia, unspecified: Secondary | ICD-10-CM | POA: Diagnosis not present

## 2023-08-26 DIAGNOSIS — G4733 Obstructive sleep apnea (adult) (pediatric): Secondary | ICD-10-CM | POA: Diagnosis not present

## 2023-08-26 DIAGNOSIS — Z9181 History of falling: Secondary | ICD-10-CM | POA: Diagnosis not present

## 2023-08-26 DIAGNOSIS — F32A Depression, unspecified: Secondary | ICD-10-CM | POA: Diagnosis not present

## 2023-08-26 DIAGNOSIS — L03115 Cellulitis of right lower limb: Secondary | ICD-10-CM | POA: Diagnosis not present

## 2023-08-26 DIAGNOSIS — I890 Lymphedema, not elsewhere classified: Secondary | ICD-10-CM | POA: Diagnosis not present

## 2023-08-26 DIAGNOSIS — I10 Essential (primary) hypertension: Secondary | ICD-10-CM | POA: Diagnosis not present

## 2023-08-26 DIAGNOSIS — G629 Polyneuropathy, unspecified: Secondary | ICD-10-CM | POA: Diagnosis not present

## 2023-08-27 DIAGNOSIS — G4733 Obstructive sleep apnea (adult) (pediatric): Secondary | ICD-10-CM | POA: Diagnosis not present

## 2023-08-27 DIAGNOSIS — Z9181 History of falling: Secondary | ICD-10-CM | POA: Diagnosis not present

## 2023-08-27 DIAGNOSIS — I10 Essential (primary) hypertension: Secondary | ICD-10-CM | POA: Diagnosis not present

## 2023-08-27 DIAGNOSIS — L03115 Cellulitis of right lower limb: Secondary | ICD-10-CM | POA: Diagnosis not present

## 2023-08-27 DIAGNOSIS — F32A Depression, unspecified: Secondary | ICD-10-CM | POA: Diagnosis not present

## 2023-08-27 DIAGNOSIS — I890 Lymphedema, not elsewhere classified: Secondary | ICD-10-CM | POA: Diagnosis not present

## 2023-08-27 DIAGNOSIS — Z6841 Body Mass Index (BMI) 40.0 and over, adult: Secondary | ICD-10-CM | POA: Diagnosis not present

## 2023-08-27 DIAGNOSIS — J45909 Unspecified asthma, uncomplicated: Secondary | ICD-10-CM | POA: Diagnosis not present

## 2023-08-27 DIAGNOSIS — D649 Anemia, unspecified: Secondary | ICD-10-CM | POA: Diagnosis not present

## 2023-08-27 DIAGNOSIS — G629 Polyneuropathy, unspecified: Secondary | ICD-10-CM | POA: Diagnosis not present

## 2023-08-31 DIAGNOSIS — J45909 Unspecified asthma, uncomplicated: Secondary | ICD-10-CM | POA: Diagnosis not present

## 2023-08-31 DIAGNOSIS — G629 Polyneuropathy, unspecified: Secondary | ICD-10-CM | POA: Diagnosis not present

## 2023-08-31 DIAGNOSIS — F32A Depression, unspecified: Secondary | ICD-10-CM | POA: Diagnosis not present

## 2023-08-31 DIAGNOSIS — D649 Anemia, unspecified: Secondary | ICD-10-CM | POA: Diagnosis not present

## 2023-08-31 DIAGNOSIS — G4733 Obstructive sleep apnea (adult) (pediatric): Secondary | ICD-10-CM | POA: Diagnosis not present

## 2023-08-31 DIAGNOSIS — Z6841 Body Mass Index (BMI) 40.0 and over, adult: Secondary | ICD-10-CM | POA: Diagnosis not present

## 2023-08-31 DIAGNOSIS — I10 Essential (primary) hypertension: Secondary | ICD-10-CM | POA: Diagnosis not present

## 2023-08-31 DIAGNOSIS — Z9181 History of falling: Secondary | ICD-10-CM | POA: Diagnosis not present

## 2023-08-31 DIAGNOSIS — L03115 Cellulitis of right lower limb: Secondary | ICD-10-CM | POA: Diagnosis not present

## 2023-08-31 DIAGNOSIS — I890 Lymphedema, not elsewhere classified: Secondary | ICD-10-CM | POA: Diagnosis not present

## 2023-09-01 DIAGNOSIS — I10 Essential (primary) hypertension: Secondary | ICD-10-CM | POA: Diagnosis not present

## 2023-09-01 DIAGNOSIS — D649 Anemia, unspecified: Secondary | ICD-10-CM | POA: Diagnosis not present

## 2023-09-01 DIAGNOSIS — G629 Polyneuropathy, unspecified: Secondary | ICD-10-CM | POA: Diagnosis not present

## 2023-09-01 DIAGNOSIS — F32A Depression, unspecified: Secondary | ICD-10-CM | POA: Diagnosis not present

## 2023-09-01 DIAGNOSIS — G4733 Obstructive sleep apnea (adult) (pediatric): Secondary | ICD-10-CM | POA: Diagnosis not present

## 2023-09-01 DIAGNOSIS — Z6841 Body Mass Index (BMI) 40.0 and over, adult: Secondary | ICD-10-CM | POA: Diagnosis not present

## 2023-09-01 DIAGNOSIS — L03115 Cellulitis of right lower limb: Secondary | ICD-10-CM | POA: Diagnosis not present

## 2023-09-01 DIAGNOSIS — Z9181 History of falling: Secondary | ICD-10-CM | POA: Diagnosis not present

## 2023-09-01 DIAGNOSIS — I890 Lymphedema, not elsewhere classified: Secondary | ICD-10-CM | POA: Diagnosis not present

## 2023-09-01 DIAGNOSIS — J45909 Unspecified asthma, uncomplicated: Secondary | ICD-10-CM | POA: Diagnosis not present

## 2023-09-03 DIAGNOSIS — Z9181 History of falling: Secondary | ICD-10-CM | POA: Diagnosis not present

## 2023-09-03 DIAGNOSIS — L03115 Cellulitis of right lower limb: Secondary | ICD-10-CM | POA: Diagnosis not present

## 2023-09-03 DIAGNOSIS — G4733 Obstructive sleep apnea (adult) (pediatric): Secondary | ICD-10-CM | POA: Diagnosis not present

## 2023-09-03 DIAGNOSIS — I890 Lymphedema, not elsewhere classified: Secondary | ICD-10-CM | POA: Diagnosis not present

## 2023-09-03 DIAGNOSIS — F32A Depression, unspecified: Secondary | ICD-10-CM | POA: Diagnosis not present

## 2023-09-03 DIAGNOSIS — J45909 Unspecified asthma, uncomplicated: Secondary | ICD-10-CM | POA: Diagnosis not present

## 2023-09-03 DIAGNOSIS — D649 Anemia, unspecified: Secondary | ICD-10-CM | POA: Diagnosis not present

## 2023-09-03 DIAGNOSIS — Z6841 Body Mass Index (BMI) 40.0 and over, adult: Secondary | ICD-10-CM | POA: Diagnosis not present

## 2023-09-03 DIAGNOSIS — G629 Polyneuropathy, unspecified: Secondary | ICD-10-CM | POA: Diagnosis not present

## 2023-09-03 DIAGNOSIS — I10 Essential (primary) hypertension: Secondary | ICD-10-CM | POA: Diagnosis not present

## 2023-09-06 DIAGNOSIS — L03115 Cellulitis of right lower limb: Secondary | ICD-10-CM | POA: Diagnosis not present

## 2023-09-06 DIAGNOSIS — D649 Anemia, unspecified: Secondary | ICD-10-CM | POA: Diagnosis not present

## 2023-09-06 DIAGNOSIS — Z9181 History of falling: Secondary | ICD-10-CM | POA: Diagnosis not present

## 2023-09-06 DIAGNOSIS — J45909 Unspecified asthma, uncomplicated: Secondary | ICD-10-CM | POA: Diagnosis not present

## 2023-09-06 DIAGNOSIS — I890 Lymphedema, not elsewhere classified: Secondary | ICD-10-CM | POA: Diagnosis not present

## 2023-09-06 DIAGNOSIS — I10 Essential (primary) hypertension: Secondary | ICD-10-CM | POA: Diagnosis not present

## 2023-09-06 DIAGNOSIS — G4733 Obstructive sleep apnea (adult) (pediatric): Secondary | ICD-10-CM | POA: Diagnosis not present

## 2023-09-06 DIAGNOSIS — G629 Polyneuropathy, unspecified: Secondary | ICD-10-CM | POA: Diagnosis not present

## 2023-09-06 DIAGNOSIS — Z6841 Body Mass Index (BMI) 40.0 and over, adult: Secondary | ICD-10-CM | POA: Diagnosis not present

## 2023-09-06 DIAGNOSIS — F32A Depression, unspecified: Secondary | ICD-10-CM | POA: Diagnosis not present

## 2023-09-14 DIAGNOSIS — G4733 Obstructive sleep apnea (adult) (pediatric): Secondary | ICD-10-CM | POA: Diagnosis not present

## 2023-09-14 DIAGNOSIS — I10 Essential (primary) hypertension: Secondary | ICD-10-CM | POA: Diagnosis not present

## 2023-09-14 DIAGNOSIS — F32A Depression, unspecified: Secondary | ICD-10-CM | POA: Diagnosis not present

## 2023-09-14 DIAGNOSIS — D649 Anemia, unspecified: Secondary | ICD-10-CM | POA: Diagnosis not present

## 2023-09-14 DIAGNOSIS — Z6841 Body Mass Index (BMI) 40.0 and over, adult: Secondary | ICD-10-CM | POA: Diagnosis not present

## 2023-09-14 DIAGNOSIS — G629 Polyneuropathy, unspecified: Secondary | ICD-10-CM | POA: Diagnosis not present

## 2023-09-14 DIAGNOSIS — L03115 Cellulitis of right lower limb: Secondary | ICD-10-CM | POA: Diagnosis not present

## 2023-09-14 DIAGNOSIS — I890 Lymphedema, not elsewhere classified: Secondary | ICD-10-CM | POA: Diagnosis not present

## 2023-09-14 DIAGNOSIS — J45909 Unspecified asthma, uncomplicated: Secondary | ICD-10-CM | POA: Diagnosis not present

## 2023-09-14 DIAGNOSIS — Z9181 History of falling: Secondary | ICD-10-CM | POA: Diagnosis not present

## 2023-09-18 DIAGNOSIS — G4733 Obstructive sleep apnea (adult) (pediatric): Secondary | ICD-10-CM | POA: Diagnosis not present

## 2023-09-21 DIAGNOSIS — F32A Depression, unspecified: Secondary | ICD-10-CM | POA: Diagnosis not present

## 2023-09-21 DIAGNOSIS — I10 Essential (primary) hypertension: Secondary | ICD-10-CM | POA: Diagnosis not present

## 2023-09-21 DIAGNOSIS — J45909 Unspecified asthma, uncomplicated: Secondary | ICD-10-CM | POA: Diagnosis not present

## 2023-09-21 DIAGNOSIS — Z9181 History of falling: Secondary | ICD-10-CM | POA: Diagnosis not present

## 2023-09-21 DIAGNOSIS — D649 Anemia, unspecified: Secondary | ICD-10-CM | POA: Diagnosis not present

## 2023-09-21 DIAGNOSIS — G629 Polyneuropathy, unspecified: Secondary | ICD-10-CM | POA: Diagnosis not present

## 2023-09-21 DIAGNOSIS — Z6841 Body Mass Index (BMI) 40.0 and over, adult: Secondary | ICD-10-CM | POA: Diagnosis not present

## 2023-09-21 DIAGNOSIS — L03115 Cellulitis of right lower limb: Secondary | ICD-10-CM | POA: Diagnosis not present

## 2023-09-21 DIAGNOSIS — G4733 Obstructive sleep apnea (adult) (pediatric): Secondary | ICD-10-CM | POA: Diagnosis not present

## 2023-09-21 DIAGNOSIS — I890 Lymphedema, not elsewhere classified: Secondary | ICD-10-CM | POA: Diagnosis not present

## 2023-09-22 ENCOUNTER — Ambulatory Visit: Payer: Medicaid Other | Admitting: Family Medicine

## 2023-09-23 DIAGNOSIS — D649 Anemia, unspecified: Secondary | ICD-10-CM | POA: Diagnosis not present

## 2023-09-23 DIAGNOSIS — F32A Depression, unspecified: Secondary | ICD-10-CM | POA: Diagnosis not present

## 2023-09-23 DIAGNOSIS — L03115 Cellulitis of right lower limb: Secondary | ICD-10-CM | POA: Diagnosis not present

## 2023-09-23 DIAGNOSIS — I890 Lymphedema, not elsewhere classified: Secondary | ICD-10-CM | POA: Diagnosis not present

## 2023-09-23 DIAGNOSIS — J45909 Unspecified asthma, uncomplicated: Secondary | ICD-10-CM | POA: Diagnosis not present

## 2023-09-23 DIAGNOSIS — G629 Polyneuropathy, unspecified: Secondary | ICD-10-CM | POA: Diagnosis not present

## 2023-09-23 DIAGNOSIS — G4733 Obstructive sleep apnea (adult) (pediatric): Secondary | ICD-10-CM | POA: Diagnosis not present

## 2023-09-23 DIAGNOSIS — Z6841 Body Mass Index (BMI) 40.0 and over, adult: Secondary | ICD-10-CM | POA: Diagnosis not present

## 2023-09-23 DIAGNOSIS — Z9181 History of falling: Secondary | ICD-10-CM | POA: Diagnosis not present

## 2023-09-23 DIAGNOSIS — I10 Essential (primary) hypertension: Secondary | ICD-10-CM | POA: Diagnosis not present

## 2023-09-24 ENCOUNTER — Ambulatory Visit: Payer: Medicaid Other | Admitting: *Deleted

## 2023-10-13 ENCOUNTER — Other Ambulatory Visit: Payer: Self-pay | Admitting: Family Medicine

## 2023-10-13 DIAGNOSIS — R32 Unspecified urinary incontinence: Secondary | ICD-10-CM

## 2023-10-19 DIAGNOSIS — G4733 Obstructive sleep apnea (adult) (pediatric): Secondary | ICD-10-CM | POA: Diagnosis not present

## 2023-11-16 DIAGNOSIS — G4733 Obstructive sleep apnea (adult) (pediatric): Secondary | ICD-10-CM | POA: Diagnosis not present

## 2023-12-17 DIAGNOSIS — G4733 Obstructive sleep apnea (adult) (pediatric): Secondary | ICD-10-CM | POA: Diagnosis not present

## 2023-12-23 ENCOUNTER — Other Ambulatory Visit: Payer: Self-pay | Admitting: Family Medicine

## 2023-12-23 DIAGNOSIS — R32 Unspecified urinary incontinence: Secondary | ICD-10-CM

## 2023-12-31 ENCOUNTER — Telehealth: Admitting: Family Medicine

## 2023-12-31 DIAGNOSIS — E559 Vitamin D deficiency, unspecified: Secondary | ICD-10-CM

## 2023-12-31 DIAGNOSIS — Z9189 Other specified personal risk factors, not elsewhere classified: Secondary | ICD-10-CM

## 2023-12-31 DIAGNOSIS — I10 Essential (primary) hypertension: Secondary | ICD-10-CM

## 2023-12-31 DIAGNOSIS — R262 Difficulty in walking, not elsewhere classified: Secondary | ICD-10-CM

## 2023-12-31 DIAGNOSIS — Z713 Dietary counseling and surveillance: Secondary | ICD-10-CM

## 2023-12-31 DIAGNOSIS — R32 Unspecified urinary incontinence: Secondary | ICD-10-CM | POA: Diagnosis not present

## 2023-12-31 DIAGNOSIS — F339 Major depressive disorder, recurrent, unspecified: Secondary | ICD-10-CM

## 2023-12-31 DIAGNOSIS — I890 Lymphedema, not elsewhere classified: Secondary | ICD-10-CM | POA: Diagnosis not present

## 2023-12-31 DIAGNOSIS — G4730 Sleep apnea, unspecified: Secondary | ICD-10-CM | POA: Diagnosis not present

## 2023-12-31 DIAGNOSIS — M792 Neuralgia and neuritis, unspecified: Secondary | ICD-10-CM | POA: Diagnosis not present

## 2023-12-31 DIAGNOSIS — R6889 Other general symptoms and signs: Secondary | ICD-10-CM

## 2023-12-31 DIAGNOSIS — D509 Iron deficiency anemia, unspecified: Secondary | ICD-10-CM

## 2023-12-31 NOTE — Patient Instructions (Signed)
 Recommended vaccines:  Tdap, Shingrix and Prevnar-20 or -21.   Please call the [facility] [phone]) to schedule a routine screening mammogram.

## 2023-12-31 NOTE — Progress Notes (Signed)
 MyChart Video Visit    Virtual Visit via Video Note   This format is felt to be most appropriate for this patient at this time. Physical exam was limited by quality of the video and audio technology used for the visit.   Patient location: Home Provider location: [facility]  I discussed the limitations of evaluation and management by telemedicine and the availability of in person appointments. The patient expressed understanding and agreed to proceed.  Patient: Gabriela Mcgee   DOB: Oct 27, 1970   53 y.o. Female  MRN: [id number] Visit Date: 12/31/2023  Today's healthcare provider: Carlean Charter, DO   Chief Complaint  Patient presents with   Follow-up   Subjective    HPI  Gabriela Mcgee is a 53 year old female who presents for medication management and follow-up.  The sores on both her legs have healed with no drainage, and she has been compliant with her medications. However, her oxybutynin  for urinary urgency has stopped being effective. She was taking two pills a day at 9 AM and 9 PM, but despite stopping and restarting the medication, she continues to experience a sudden urge to urinate upon waking, sometimes resulting in incontinence. This issue has persisted for about a month.  She is currently on Celexa  20 mg and reports doing well on it. She recently refilled all her medications, including oxybutynin , through [place], although she experienced some issues with refill timing. She also takes Elavil  and losartan , and she monitors her blood pressure regularly, although her blood pressure cuff is currently not working.  She experiences difficulty regulating her body temperature, often feeling cold when others are warm and vice versa. Her temperature ranges between 96.44F and 98.13F. She attributes this to her lymphedema, which has worsened over time. She has low hemoglobin levels but has not been taking iron supplements. She takes over-the-counter B6 and vitamin  D, although she ran out of the prescribed vitamin D  and was unable to refill it due to transportation issues.  She has been working on her diet with her sister, who is diabetic, focusing on more protein and fewer carbs, and using an air fryer instead of deep frying. She reports improved mobility due to physical therapy, with one foot significantly reduced in swelling, allowing her to wear better-fitting shoes.  She has not received the COVID booster this year but is interested in getting it. She has not had a Pap smear since her hysterectomy in her thirties, which was performed due to fibroid tumors and heavy bleeding. She has not had a mammogram recently due to transportation issues but plans to schedule one once she resolves these issues.  She uses an adjustable bed provided by [employer], which has significantly improved her ability to maneuver and manage her lymphedema by elevating her legs.  Having hospital bed also reduces patient's risk for aspiration, as she is unable to maneuver herself on a normal bed, including rolling over, and experiences difficulty breathing/choking while lying flat.  Her most recent height and weight are 4'11 and 438 pounds, respectively.  She requires periodic documentation from her healthcare provider to continue using the bed.    Medications: Outpatient Medications Prior to Visit  Medication Sig   albuterol  (VENTOLIN  HFA) 108 (90 Base) MCG/ACT inhaler Inhale 1-2 puffs into the lungs every 6 (six) hours as needed for shortness of breath.   ibuprofen (ADVIL) 200 MG tablet Take 600 mg by mouth every 6 (six) hours as needed.   loratadine  (CLARITIN )  10 MG tablet Take 1 tablet (10 mg total) by mouth daily.   [DISCONTINUED] amitriptyline  (ELAVIL ) 25 MG tablet Take 1 tablet (25 mg total) by mouth at bedtime.   [DISCONTINUED] citalopram  (CELEXA ) 20 MG tablet Take 1 tablet (20 mg total) by mouth daily.   [DISCONTINUED] losartan  (COZAAR ) 25 MG tablet Take 1 tablet (25 mg total)  by mouth daily.   [DISCONTINUED] oxybutynin  (DITROPAN -XL) 5 MG 24 hr tablet Take 1 tablet by mouth twice daily   [DISCONTINUED] Vitamin D , Ergocalciferol , (DRISDOL ) 1.25 MG (50000 UNIT) CAPS capsule Take 1 capsule (50,000 Units total) by mouth every 7 (seven) days.   No facility-administered medications prior to visit.    Review of Systems  Respiratory: Negative.  Negative for cough, shortness of breath and wheezing.   Cardiovascular:  Negative for chest pain, palpitations and leg swelling.  Endocrine: Positive for cold intolerance. Negative for heat intolerance.  Neurological:  Positive for weakness (improving w/PT but otherwise at baseline). Negative for headaches.        Objective    There were no vitals taken for this visit.      Physical Exam Constitutional:      General: She is not in acute distress.    Appearance: Normal appearance.  HENT:     Head: Normocephalic.  Eyes:     Comments: Glasses on  Pulmonary:     Effort: Pulmonary effort is normal. No respiratory distress.  Neurological:     Mental Status: She is alert and oriented to person, place, and time. Mental status is at baseline.       Assessment & Plan    Microcytic anemia -     Iron (Ferrous Sulfate); Take 325 mg by mouth daily.  Dispense: 90 tablet; Refill: 3  Urinary incontinence, unspecified type -     oxyBUTYnin  Chloride ER; Take two tablets in the morning and one tablet in the evening. After one week (7 days), you may increase to two tablets daily if symptoms not improved.  Dispense: 180 tablet; Refill: 0  Depression, recurrent (HCC) -     Citalopram  Hydrobromide; Take 1 tablet (20 mg total) by mouth daily.  Dispense: 30 tablet; Refill: 2  Peripheral neuropathic pain -     Amitriptyline  HCl; Take 1 tablet (25 mg total) by mouth at bedtime.  Dispense: 30 tablet; Refill: 2  Primary hypertension -     Losartan  Potassium; Take 1 tablet (25 mg total) by mouth daily.  Dispense: 30 tablet; Refill:  2  Vitamin D  deficiency -     Vitamin D  (Ergocalciferol ); Take 1 capsule (50,000 Units total) by mouth every 7 (seven) days.  Dispense: 12 capsule; Refill: 1  Weight loss counseling, encounter for -     Semaglutide-Weight Management; Inject 0.25 mg into the skin once a week for 28 days.  Dispense: 2 mL; Refill: 0 -     Semaglutide-Weight Management; Inject 0.5 mg into the skin once a week for 28 days.  Dispense: 2 mL; Refill: 0  Morbid obesity with BMI of 60.0-69.9, adult (HCC)  At high risk for aspiration  Lymphedema of both lower extremities  Ambulatory dysfunction  Difficulty maintaining body in lying position  Sleep apnea treated with nocturnal bilevel positive airway pressure (BPAP)     Urinary Urgency Oxybutynin  previously effective, now ineffective. Persistent urgency upon waking. Normal kidney function. - Increase oxybutynin  to two tablets in the morning and one in the evening for one week. If no improvement, increase to two tablets twice  daily.  Morbid obesity with BMI of 60.0-69.9 Interested in weight loss. No contraindications for GLP-1 receptor agonists.  Patient continues to work on improving her diet and increasing her activity.  She has recently gained improved mobility with physical therapy. - Prescribe semaglutide to facilitate weight loss.  Hypertension Requires new blood pressure cuff. On losartan , needs refill. - Send prescription for losartan .  Vitamin D  Deficiency Previously low vitamin D  levels. Refill not received due to pharmacy issues. - Send prescription for vitamin D  with refill.  Lymphedema of both lower extremities Difficulty regulating body temperature. Low hemoglobin may contribute to feeling cold. - Consider iron supplementation. - Send prescription renewal for hospital bed as noted below  At high risk for aspiration, ambulatory dysfunction, difficulty maintaining body in lying position, sleep apnea treated with BiPAP She uses an  adjustable bed provided by [employer], which has significantly improved her ability to maneuver and manage her lymphedema by elevating her legs.  Having hospital bed also reduces her risk for aspiration, as she is unable to maneuver herself on a normal bed, including rolling over, and experiences difficulty breathing/choking while lying flat.  Her most recent height and weight are 4'11 and 438 pounds, respectively.   - Will send prescription manual for hospital bed as noted.  General Health Maintenance Due for vaccinations and screenings. Transportation issues for mammogram and Pap smear. - Recommend COVID booster, shingles vaccine, pneumonia vaccine, and tetanus vaccine. - Encourage scheduling mammogram and Pap smear when transportation is available.    Return in about 3 months (around 03/31/2024).     I discussed the assessment and treatment plan with the patient. The patient was provided an opportunity to ask questions and all were answered. The patient agreed with the plan and demonstrated an understanding of the instructions.   The patient was advised to call back or seek an in-person evaluation if the symptoms worsen or if the condition fails to improve as anticipated.  I provided 22 minutes of virtual-face-to-face time during this encounter.   Carlean Charter, DO [facility] [phone] (phone) [phone] (fax)  [facility]

## 2024-01-10 ENCOUNTER — Encounter: Payer: Self-pay | Admitting: Family Medicine

## 2024-01-10 MED ORDER — IRON (FERROUS SULFATE) 325 (65 FE) MG PO TABS: 325.0000 mg | ORAL_TABLET | Freq: Every day | ORAL | 3 refills | Status: AC

## 2024-01-10 MED ORDER — VITAMIN D (ERGOCALCIFEROL) 1.25 MG (50000 UNIT) PO CAPS: 50000.0000 [IU] | ORAL_CAPSULE | ORAL | 1 refills | Status: DC

## 2024-01-10 MED ORDER — CITALOPRAM HYDROBROMIDE 20 MG PO TABS: 20.0000 mg | ORAL_TABLET | Freq: Every day | ORAL | 2 refills | Status: DC

## 2024-01-10 MED ORDER — OXYBUTYNIN CHLORIDE ER 5 MG PO TB24: ORAL_TABLET | ORAL | 0 refills | Status: DC

## 2024-01-10 MED ORDER — SEMAGLUTIDE-WEIGHT MANAGEMENT 0.5 MG/0.5ML ~~LOC~~ SOAJ: 0.5000 mg | SUBCUTANEOUS | 0 refills | Status: AC

## 2024-01-10 MED ORDER — SEMAGLUTIDE-WEIGHT MANAGEMENT 0.25 MG/0.5ML ~~LOC~~ SOAJ: 0.2500 mg | SUBCUTANEOUS | 0 refills | Status: DC

## 2024-01-10 MED ORDER — AMITRIPTYLINE HCL 25 MG PO TABS: 25.0000 mg | ORAL_TABLET | Freq: Every day | ORAL | 2 refills | Status: DC

## 2024-01-10 MED ORDER — LOSARTAN POTASSIUM 25 MG PO TABS: 25.0000 mg | ORAL_TABLET | Freq: Every day | ORAL | 2 refills | Status: DC

## 2024-01-11 ENCOUNTER — Telehealth: Payer: Self-pay

## 2024-01-11 ENCOUNTER — Other Ambulatory Visit (HOSPITAL_COMMUNITY): Payer: Self-pay

## 2024-01-11 NOTE — Telephone Encounter (Signed)
 Pharmacy Patient Advocate Encounter   Received notification from Onbase that prior authorization for Wegovy 0.25MG /0.5ML auto-injectors is required/requested.   Insurance verification completed.   The patient is insured through [facility] .   Per test claim: PA required; However, NEW/RECENT labs/notes are needed to complete & submit PA request. Please see below.  *Insurance requires weight and BMI be measured in the last 45 days, I do not see where her weight has not been checked since 10/24. Please document in chart what her current weight and BMI are.

## 2024-01-16 DIAGNOSIS — G4733 Obstructive sleep apnea (adult) (pediatric): Secondary | ICD-10-CM | POA: Diagnosis not present

## 2024-01-17 NOTE — Telephone Encounter (Signed)
 Spoke with pt her current wt is 440 lbs

## 2024-01-18 NOTE — Telephone Encounter (Signed)
 Updated weight and BMI and sent to insurance company via [facility]. Key# [id number]

## 2024-01-19 ENCOUNTER — Other Ambulatory Visit (HOSPITAL_COMMUNITY): Payer: Self-pay

## 2024-01-19 NOTE — Telephone Encounter (Signed)
 Pharmacy Patient Advocate Encounter  Received notification from [facility] that Prior Authorization for Wegovy  0.25MG /0.5ML auto-injectors has been APPROVED from 01/18/24 to 07/16/24. Ran test claim, Copay is $4. This test claim was processed through [facility]- copay amounts may vary at other pharmacies due to pharmacy/plan contracts, or as the patient moves through the different stages of their insurance plan.   PA #/Case ID/Reference #: [id number]

## 2024-01-27 ENCOUNTER — Encounter: Payer: Self-pay | Admitting: Family Medicine

## 2024-01-28 ENCOUNTER — Other Ambulatory Visit: Payer: Self-pay

## 2024-01-28 DIAGNOSIS — Z713 Dietary counseling and surveillance: Secondary | ICD-10-CM

## 2024-01-28 MED ORDER — SEMAGLUTIDE-WEIGHT MANAGEMENT 0.25 MG/0.5ML ~~LOC~~ SOAJ: 0.2500 mg | SUBCUTANEOUS | 0 refills | Status: AC

## 2024-02-16 DIAGNOSIS — G4733 Obstructive sleep apnea (adult) (pediatric): Secondary | ICD-10-CM | POA: Diagnosis not present

## 2024-02-20 ENCOUNTER — Other Ambulatory Visit: Payer: Self-pay | Admitting: Family Medicine

## 2024-02-20 DIAGNOSIS — Z713 Dietary counseling and surveillance: Secondary | ICD-10-CM

## 2024-03-17 ENCOUNTER — Other Ambulatory Visit: Payer: Self-pay | Admitting: Family Medicine

## 2024-03-17 DIAGNOSIS — G4733 Obstructive sleep apnea (adult) (pediatric): Secondary | ICD-10-CM | POA: Diagnosis not present

## 2024-03-17 DIAGNOSIS — Z713 Dietary counseling and surveillance: Secondary | ICD-10-CM

## 2024-03-21 ENCOUNTER — Other Ambulatory Visit: Payer: Self-pay | Admitting: Family Medicine

## 2024-03-21 DIAGNOSIS — R32 Unspecified urinary incontinence: Secondary | ICD-10-CM

## 2024-04-12 ENCOUNTER — Other Ambulatory Visit: Payer: Self-pay | Admitting: Family Medicine

## 2024-04-12 DIAGNOSIS — I10 Essential (primary) hypertension: Secondary | ICD-10-CM

## 2024-04-17 DIAGNOSIS — G4733 Obstructive sleep apnea (adult) (pediatric): Secondary | ICD-10-CM | POA: Diagnosis not present

## 2024-05-16 ENCOUNTER — Encounter: Payer: Self-pay | Admitting: Family Medicine

## 2024-05-16 ENCOUNTER — Ambulatory Visit (INDEPENDENT_AMBULATORY_CARE_PROVIDER_SITE_OTHER): Admitting: Family Medicine

## 2024-05-16 VITALS — BP 123/64 | HR 64 | Ht 59.0 in | Wt >= 6400 oz

## 2024-05-16 DIAGNOSIS — J302 Other seasonal allergic rhinitis: Secondary | ICD-10-CM

## 2024-05-16 DIAGNOSIS — F339 Major depressive disorder, recurrent, unspecified: Secondary | ICD-10-CM

## 2024-05-16 DIAGNOSIS — D509 Iron deficiency anemia, unspecified: Secondary | ICD-10-CM | POA: Diagnosis not present

## 2024-05-16 DIAGNOSIS — R32 Unspecified urinary incontinence: Secondary | ICD-10-CM | POA: Diagnosis not present

## 2024-05-16 DIAGNOSIS — R262 Difficulty in walking, not elsewhere classified: Secondary | ICD-10-CM | POA: Diagnosis not present

## 2024-05-16 DIAGNOSIS — Z136 Encounter for screening for cardiovascular disorders: Secondary | ICD-10-CM | POA: Diagnosis not present

## 2024-05-16 DIAGNOSIS — Z9189 Other specified personal risk factors, not elsewhere classified: Secondary | ICD-10-CM

## 2024-05-16 DIAGNOSIS — Z23 Encounter for immunization: Secondary | ICD-10-CM | POA: Diagnosis not present

## 2024-05-16 DIAGNOSIS — Z713 Dietary counseling and surveillance: Secondary | ICD-10-CM

## 2024-05-16 DIAGNOSIS — G4730 Sleep apnea, unspecified: Secondary | ICD-10-CM | POA: Diagnosis not present

## 2024-05-16 DIAGNOSIS — E559 Vitamin D deficiency, unspecified: Secondary | ICD-10-CM

## 2024-05-16 DIAGNOSIS — Z1231 Encounter for screening mammogram for malignant neoplasm of breast: Secondary | ICD-10-CM

## 2024-05-16 DIAGNOSIS — I10 Essential (primary) hypertension: Secondary | ICD-10-CM

## 2024-05-16 DIAGNOSIS — M792 Neuralgia and neuritis, unspecified: Secondary | ICD-10-CM

## 2024-05-16 DIAGNOSIS — R7303 Prediabetes: Secondary | ICD-10-CM

## 2024-05-16 MED ORDER — MIRABEGRON ER 25 MG PO TB24: 25.0000 mg | ORAL_TABLET | Freq: Every day | ORAL | 1 refills | Status: AC

## 2024-05-16 MED ORDER — AMITRIPTYLINE HCL 25 MG PO TABS: 25.0000 mg | ORAL_TABLET | Freq: Every evening | ORAL | 2 refills | Status: AC | PRN

## 2024-05-16 MED ORDER — CITALOPRAM HYDROBROMIDE 40 MG PO TABS: 40.0000 mg | ORAL_TABLET | Freq: Every day | ORAL | 1 refills | Status: AC

## 2024-05-16 MED ORDER — SOLIFENACIN SUCCINATE 5 MG PO TABS: 5.0000 mg | ORAL_TABLET | Freq: Every day | ORAL | 1 refills | Status: DC

## 2024-05-16 MED ORDER — LOSARTAN POTASSIUM 25 MG PO TABS
25.0000 mg | ORAL_TABLET | Freq: Every day | ORAL | 3 refills | Status: AC
Start: 2024-05-16 — End: ?

## 2024-05-16 MED ORDER — WEGOVY 1 MG/0.5ML ~~LOC~~ SOAJ
1.0000 mg | SUBCUTANEOUS | 2 refills | Status: DC
Start: 2024-05-16 — End: 2024-06-27

## 2024-05-16 MED ORDER — LORATADINE 10 MG PO TABS: 10.0000 mg | ORAL_TABLET | Freq: Every day | ORAL | 3 refills | Status: AC

## 2024-05-16 NOTE — Patient Instructions (Signed)
 Please call the [facility] [phone]) to schedule a routine screening mammogram.

## 2024-05-16 NOTE — Progress Notes (Signed)
 Established patient visit   Patient: Gabriela Mcgee   DOB: Dec 12, 1970   53 y.o. Female  MRN: [id number] Visit Date: 05/16/2024  Today's healthcare provider: LAURAINE LOISE BUOY, DO   Chief Complaint  Patient presents with   Weight Loss    The medication is helping a little (not able to get weight) discuss the dose    Medication Refill    Med no longer working (oxy)    Referral    Requesting a referral to have a breast reduction    Subjective    Medication Refill  Gabriela Mcgee is a 53 year old female with lymphedema and depression who presents for medication management and follow-up.  She experiences challenges with weight management due to lymphedema, which causes significant fluid retention. She is currently on Wegovy  0.5 mg but was mistakenly given a lower dose previously. She experiences nausea on the first day of the week when taking the medication but feels better afterward. She acknowledges overeating at times but is learning to manage her portions better.  She is on citalopram  for depression but still experiences days of feeling overwhelmed and agitated, particularly when tasks seem unmanageable. Episodes of agitation occur about two days a week, without a clear trigger. She has a supportive family, particularly her sister, who assists her with transportation and daily activities.  She has been using Elavil  for pain management but reports significant improvement since acquiring a hospital bed that allows her to stretch and position her legs comfortably. She now takes Elavil  only once every two to three weeks as needed.  She has been taking oxybutynin  for bladder issues, but it has become ineffective after six to seven months of use. She stopped taking it as it no longer provided relief.  She continues to take losartan  for blood pressure management and reports good control. She also takes Claritin  and iron  supplements without issues.  She uses a BiPAP machine for sleep  apnea but is concerned about the machine's warranty expiration and the need for a hospital bed during sleep studies due to difficulty breathing when lying flat. She reports no diarrhea, constipation, or vomiting with her current medications.       Medications: Outpatient Medications Prior to Visit  Medication Sig   albuterol  (VENTOLIN  HFA) 108 (90 Base) MCG/ACT inhaler Inhale 1-2 puffs into the lungs every 6 (six) hours as needed for shortness of breath.   ibuprofen (ADVIL) 200 MG tablet Take 600 mg by mouth every 6 (six) hours as needed.   Iron , Ferrous Sulfate , 325 (65 Fe) MG TABS Take 325 mg by mouth daily.   [DISCONTINUED] amitriptyline  (ELAVIL ) 25 MG tablet Take 1 tablet (25 mg total) by mouth at bedtime.   [DISCONTINUED] citalopram  (CELEXA ) 20 MG tablet Take 1 tablet (20 mg total) by mouth daily.   [DISCONTINUED] loratadine  (CLARITIN ) 10 MG tablet Take 1 tablet (10 mg total) by mouth daily.   [DISCONTINUED] losartan  (COZAAR ) 25 MG tablet Take 1 tablet by mouth once daily   [DISCONTINUED] oxybutynin  (DITROPAN -XL) 5 MG 24 hr tablet Take 1 tablet by mouth twice daily   [DISCONTINUED] Semaglutide -Weight Management (WEGOVY ) 0.5 MG/0.5ML SOAJ Inject 0.5 mg into the skin once a week.   [DISCONTINUED] Vitamin D , Ergocalciferol , (DRISDOL ) 1.25 MG (50000 UNIT) CAPS capsule Take 1 capsule (50,000 Units total) by mouth every 7 (seven) days.   No facility-administered medications prior to visit.        Objective    BP 123/64 (BP Location:  Right Arm, Patient Position: Sitting, Cuff Size: Normal)   Pulse 64   Ht 4' 11 (1.499 m)   Wt (!) 431 lb (195.5 kg)   SpO2 100%   BMI 87.05 kg/m     Physical Exam Vitals and nursing note reviewed.  Constitutional:      General: She is not in acute distress.    Appearance: Normal appearance.  HENT:     Head: Normocephalic and atraumatic.  Eyes:     General: No scleral icterus.    Conjunctiva/sclera: Conjunctivae normal.  Cardiovascular:      Rate and Rhythm: Normal rate.  Pulmonary:     Effort: Pulmonary effort is normal.  Neurological:     Mental Status: She is alert and oriented to person, place, and time. Mental status is at baseline.  Psychiatric:        Mood and Affect: Mood normal.        Behavior: Behavior normal.      Results for orders placed or performed in visit on 05/16/24  CBC  Result Value Ref Range   WBC 4.9 3.4 - 10.8 x10E3/uL   RBC 5.14 3.77 - 5.28 x10E6/uL   Hemoglobin 12.8 11.1 - 15.9 g/dL   Hematocrit 57.6 65.9 - 46.6 %   MCV 82 79 - 97 fL   MCH 24.9 (L) 26.6 - 33.0 pg   MCHC 30.3 (L) 31.5 - 35.7 g/dL   RDW 85.4 88.2 - 84.5 %   Platelets 151 150 - 450 x10E3/uL  Comprehensive metabolic panel with GFR  Result Value Ref Range   Glucose 84 70 - 99 mg/dL   BUN 14 6 - 24 mg/dL   Creatinine, Ser 9.40 0.57 - 1.00 mg/dL   eGFR 892 >40 fO/fpw/8.26   BUN/Creatinine Ratio 24 (H) 9 - 23   Sodium 140 134 - 144 mmol/L   Potassium 4.5 3.5 - 5.2 mmol/L   Chloride 103 96 - 106 mmol/L   CO2 21 20 - 29 mmol/L   Calcium 9.8 8.7 - 10.2 mg/dL   Total Protein 7.9 6.0 - 8.5 g/dL   Albumin 4.3 3.8 - 4.9 g/dL   Globulin, Total 3.6 1.5 - 4.5 g/dL   Bilirubin Total 1.0 0.0 - 1.2 mg/dL   Alkaline Phosphatase 87 44 - 121 IU/L   AST 12 0 - 40 IU/L   ALT 10 0 - 32 IU/L  Hemoglobin A1c  Result Value Ref Range   Hgb A1c MFr Bld 5.8 (H) 4.8 - 5.6 %   Est. average glucose Bld gHb Est-mCnc 120 mg/dL  Lipid panel  Result Value Ref Range   Cholesterol, Total 139 100 - 199 mg/dL   Triglycerides 70 0 - 149 mg/dL   HDL 59 >60 mg/dL   VLDL Cholesterol Cal 14 5 - 40 mg/dL   LDL Chol Calc (NIH) 66 0 - 99 mg/dL   Chol/HDL Ratio 2.4 0.0 - 4.4 ratio  VITAMIN D  25 Hydroxy (Vit-D Deficiency, Fractures)  Result Value Ref Range   Vit D, 25-Hydroxy 26.9 (L) 30.0 - 100.0 ng/mL    Assessment & Plan    Depression, recurrent (HCC) -     Citalopram  Hydrobromide; Take 1 tablet (40 mg total) by mouth daily.  Dispense: 90 tablet;  Refill: 1  Morbid obesity with BMI of 70 and over, adult (HCC) -     Wegovy ; Inject 1 mg into the skin once a week.  Dispense: 2 mL; Refill: 2 -     Ambulatory referral to  Sleep Studies -     Ambulatory Referral for Breast Surgery  Weight loss counseling, encounter for -     Wegovy ; Inject 1 mg into the skin once a week.  Dispense: 2 mL; Refill: 2  Sleep apnea treated with nocturnal bilevel positive airway pressure (BPAP) -     Ambulatory referral to Sleep Studies  At high risk for aspiration  Ambulatory dysfunction  Peripheral neuropathic pain -     Amitriptyline  HCl; Take 1 tablet (25 mg total) by mouth at bedtime as needed for sleep.  Dispense: 90 tablet; Refill: 2  Primary hypertension -     Losartan  Potassium; Take 1 tablet (25 mg total) by mouth daily.  Dispense: 90 tablet; Refill: 3 -     Comprehensive metabolic panel with GFR -     Lipid panel  Seasonal allergies -     Loratadine ; Take 1 tablet (10 mg total) by mouth daily.  Dispense: 90 tablet; Refill: 3  Urinary incontinence, unspecified type -     Mirabegron  ER; Take 1 tablet (25 mg total) by mouth daily.  Dispense: 30 tablet; Refill: 1 -     Solifenacin  Succinate; Take 1 tablet (5 mg total) by mouth daily.  Dispense: 30 tablet; Refill: 1 -     Ambulatory referral to Urology  Iron  deficiency anemia, unspecified iron  deficiency anemia type -     CBC  Prediabetes -     Hemoglobin A1c  Vitamin D  deficiency -     VITAMIN D  25 Hydroxy (Vit-D Deficiency, Fractures)  Encounter for screening for cardiovascular disorders -     Lipid panel  Needs flu shot -     Flu vaccine trivalent PF, 6mos and older(Flulaval,Afluria,Fluarix,Fluzone )  Encounter for screening mammogram for breast cancer -     3D Screening Mammogram, Left and Right; Future      Morbid obesity with BMI of 70 and over; weight loss counseling Obesity with lymphedema complicating weight measurement. Previous Wegovy  dosing issues resolved. Current  dose 0.5 mg with mild nausea. Plan to increase to 1 mg for better weight control.  Per her request, we will also refer her for breast reduction surgery due to significant weight associated with her current size leading to pain. - Increase Wegovy  to 1 mg weekly. - Order A1c to monitor glucose levels. - Referred to breast surgery  Depression, recurrent Depressive disorder with agitation and low motivation. Discussed increasing citalopram  to 40 mg, typical max dose, with adjustment based on response. - Increase citalopram  to 40 mg daily. - Monitor mood and agitation. - Consider alternative treatment if no improvement.  Sleep apnea treated with nocturnal bilevel positive airway pressure; at high risk of aspiration Patient has history of sleep apnea for which she uses a BiPAP nightly.  She is also at high risk for aspiration due to severe morbid obesity and ambulatory dysfunction.  At this time, she is using hospital bed, which provides her with significant benefit, with ability to keep her head elevated, allowing her to breeze, as well as to reposition herself and lift herself using her trapeze bar.  This is significant improvement over her normal severely limited mobility and allows her to elevate her head in the event she needs to cough, significantly reducing her risk of aspiration.  Current BiPAP noted to be out of warranty.  Will refer her to sleep studies for reevaluation of her sleep apnea for a new BiPAP prescription.  General Health Maintenance Routine health maintenance and preventative care measures discussed. -  Administer flu shot. - Order metabolic panel, complete blood count, and vitamin D  level. - Refer for mammogram and sleep study with hospital bed accommodation.  Primary hypertension Chronic, stable.  Well-controlled on losartan  25 mg.  Will refill today.     Return in about 6 weeks (around 06/27/2024) for Chronic f/u.      I discussed the assessment and treatment plan with  the patient  The patient was provided an opportunity to ask questions and all were answered. The patient agreed with the plan and demonstrated an understanding of the instructions.   The patient was advised to call back or seek an in-person evaluation if the symptoms worsen or if the condition fails to improve as anticipated.    LAURAINE LOISE BUOY, DO  [facility] [phone] (phone) [phone] (fax)  [facility]

## 2024-05-17 ENCOUNTER — Other Ambulatory Visit (HOSPITAL_COMMUNITY): Payer: Self-pay

## 2024-05-17 LAB — COMPREHENSIVE METABOLIC PANEL WITH GFR
ALT: 10 IU/L (ref 0–32)
AST: 12 IU/L (ref 0–40)
Albumin: 4.3 g/dL (ref 3.8–4.9)
Alkaline Phosphatase: 87 IU/L (ref 44–121)
BUN/Creatinine Ratio: 24 — ABNORMAL HIGH (ref 9–23)
BUN: 14 mg/dL (ref 6–24)
Bilirubin Total: 1 mg/dL (ref 0.0–1.2)
CO2: 21 mmol/L (ref 20–29)
Calcium: 9.8 mg/dL (ref 8.7–10.2)
Chloride: 103 mmol/L (ref 96–106)
Creatinine, Ser: 0.59 mg/dL (ref 0.57–1.00)
Globulin, Total: 3.6 g/dL (ref 1.5–4.5)
Glucose: 84 mg/dL (ref 70–99)
Potassium: 4.5 mmol/L (ref 3.5–5.2)
Sodium: 140 mmol/L (ref 134–144)
Total Protein: 7.9 g/dL (ref 6.0–8.5)
eGFR: 107 mL/min/1.73 (ref >59)

## 2024-05-17 LAB — CBC
Hematocrit: 42.3 % (ref 34.0–46.6)
Hemoglobin: 12.8 g/dL (ref 11.1–15.9)
MCH: 24.9 pg — ABNORMAL LOW (ref 26.6–33.0)
MCHC: 30.3 g/dL — ABNORMAL LOW (ref 31.5–35.7)
MCV: 82 fL (ref 79–97)
Platelets: 151 x10E3/uL (ref 150–450)
RBC: 5.14 x10E6/uL (ref 3.77–5.28)
RDW: 14.5 % (ref 11.7–15.4)
WBC: 4.9 x10E3/uL (ref 3.4–10.8)

## 2024-05-17 LAB — VITAMIN D 25 HYDROXY (VIT D DEFICIENCY, FRACTURES): Vit D, 25-Hydroxy: 26.9 ng/mL — ABNORMAL LOW (ref 30.0–100.0)

## 2024-05-17 LAB — HEMOGLOBIN A1C
Est. average glucose Bld gHb Est-mCnc: 120 mg/dL
Hgb A1c MFr Bld: 5.8 % — ABNORMAL HIGH (ref 4.8–5.6)

## 2024-05-17 LAB — LIPID PANEL
Chol/HDL Ratio: 2.4 ratio (ref 0.0–4.4)
Cholesterol, Total: 139 mg/dL (ref 100–199)
HDL: 59 mg/dL (ref >39)
LDL Chol Calc (NIH): 66 mg/dL (ref 0–99)
Triglycerides: 70 mg/dL (ref 0–149)
VLDL Cholesterol Cal: 14 mg/dL (ref 5–40)

## 2024-05-24 ENCOUNTER — Other Ambulatory Visit (HOSPITAL_COMMUNITY): Payer: Self-pay

## 2024-05-25 ENCOUNTER — Other Ambulatory Visit (HOSPITAL_COMMUNITY): Payer: Self-pay

## 2024-05-25 ENCOUNTER — Ambulatory Visit: Payer: Self-pay | Admitting: Family Medicine

## 2024-05-25 DIAGNOSIS — E559 Vitamin D deficiency, unspecified: Secondary | ICD-10-CM

## 2024-05-25 MED ORDER — VITAMIN D (ERGOCALCIFEROL) 1.25 MG (50000 UNIT) PO CAPS: 50000.0000 [IU] | ORAL_CAPSULE | ORAL | 1 refills | Status: AC

## 2024-05-28 ENCOUNTER — Encounter: Payer: Self-pay | Admitting: Family Medicine

## 2024-05-30 ENCOUNTER — Telehealth: Payer: Self-pay | Admitting: Family Medicine

## 2024-05-30 NOTE — Telephone Encounter (Signed)
 Spoke with Dr. Donzella.  She is signing it and patient may pick it up on 05/31/24.  Patient advised.

## 2024-05-30 NOTE — Telephone Encounter (Signed)
 Copied from CRM [id number]. Topic: General - Other >> May 30, 2024  3:25 PM Tiffini S wrote: Reason for CRM: Patient states that her sister dropped off a form for a handicap placard on 05/25/24- asked for update for paperwork.  Called [employer], per Niels do not remember the patient bring in the form/ sent a message to the provider- will research and call the patient back. Please call the patient back at [phone].

## 2024-06-07 ENCOUNTER — Other Ambulatory Visit: Payer: Self-pay | Admitting: Family Medicine

## 2024-06-27 ENCOUNTER — Encounter: Payer: Self-pay | Admitting: Family Medicine

## 2024-06-27 ENCOUNTER — Ambulatory Visit: Admitting: Family Medicine

## 2024-06-27 VITALS — BP 130/65 | HR 70 | Temp 98.0°F | Ht 59.0 in | Wt >= 6400 oz

## 2024-06-27 DIAGNOSIS — G4730 Sleep apnea, unspecified: Secondary | ICD-10-CM

## 2024-06-27 DIAGNOSIS — I890 Lymphedema, not elsewhere classified: Secondary | ICD-10-CM

## 2024-06-27 DIAGNOSIS — Z23 Encounter for immunization: Secondary | ICD-10-CM | POA: Diagnosis not present

## 2024-06-27 DIAGNOSIS — Z6841 Body Mass Index (BMI) 40.0 and over, adult: Secondary | ICD-10-CM | POA: Diagnosis not present

## 2024-06-27 DIAGNOSIS — R262 Difficulty in walking, not elsewhere classified: Secondary | ICD-10-CM | POA: Diagnosis not present

## 2024-06-27 MED ORDER — TIRZEPATIDE-WEIGHT MANAGEMENT 2.5 MG/0.5ML ~~LOC~~ SOLN: 2.5000 mg | SUBCUTANEOUS | 0 refills | Status: DC

## 2024-06-27 NOTE — Patient Instructions (Signed)
 Recommended vaccines: Covid booster, Shingrix (shingles), Prevnar-20 or -21 (pneumonia)

## 2024-06-27 NOTE — Progress Notes (Signed)
 Established patient visit   Patient: Gabriela Mcgee   DOB: 02-05-1971   53 y.o. Female  MRN: [id number] Visit Date: 06/27/2024  Today's healthcare provider: LAURAINE LOISE BUOY, DO   Chief Complaint  Patient presents with   Medical Management of Chronic Issues    Patient reports she is here for a 6 week follow up.  Discuss medications, one medication that she thinks that her insurance will not cover and she wants to discuss alternatives.  Prescription for a bariatric wheelchair.  All vaccines declined   Subjective    HPI Gabriela Mcgee is a 53 year old female who presents for a follow-up regarding her medication and mobility issues.  She is currently taking Wegovy  and has requested a refill. She is uncertain if her insurance will continue to cover it for weight loss, as she received a letter indicating today might stop coverage. She is uncertain if her insurance will continue to cover Wegovy  for weight loss and has heard that Zepbound may be covered for sleep apnea, based on information from a letter and a commercial. She has not stopped receiving Wegovy  yet.  She has been provided with a wheelchair by [employer], but it is not a bariatric wheelchair and is too small for her comfort. She needs a bariatric wheelchair for mobility outside the house, especially for long distances like those at [facility], where she has an upcoming appointment. At home, she uses a vibrating plate for her legs, which causes itching but is otherwise beneficial. She has been walking in her hallway at home and feels her mobility has improved, as she was released from occupational therapy after showing progress in walking, but she still requires a wheelchair to get around and complete activities of daily living.  Her bladder medication is not fully effective but she has an upcoming appointment with urology. She also has an appointment with the sleep study team, where she will bring her machine for  evaluation. She can sleep at any time depending on her tiredness.  She has not received the COVID vaccine yet but is considering it before her visit to [facility] due to the larger hospital environment. She has had the flu shot but not the shingles or pneumonia vaccines. She is considering the shingles vaccine due to her history of chickenpox. She is unsure about the pneumonia vaccine but is open to receiving it.      Medications: Outpatient Medications Prior to Visit  Medication Sig   albuterol  (VENTOLIN  HFA) 108 (90 Base) MCG/ACT inhaler Inhale 1-2 puffs into the lungs every 6 (six) hours as needed for shortness of breath.   amitriptyline  (ELAVIL ) 25 MG tablet Take 1 tablet (25 mg total) by mouth at bedtime as needed for sleep.   citalopram  (CELEXA ) 40 MG tablet Take 1 tablet (40 mg total) by mouth daily.   ibuprofen (ADVIL) 200 MG tablet Take 600 mg by mouth every 6 (six) hours as needed.   Iron , Ferrous Sulfate , 325 (65 Fe) MG TABS Take 325 mg by mouth daily.   loratadine  (CLARITIN ) 10 MG tablet Take 1 tablet (10 mg total) by mouth daily.   losartan  (COZAAR ) 25 MG tablet Take 1 tablet (25 mg total) by mouth daily.   mirabegron  ER (MYRBETRIQ ) 25 MG TB24 tablet Take 1 tablet (25 mg total) by mouth daily.   solifenacin  (VESICARE ) 5 MG tablet Take 1 tablet (5 mg total) by mouth daily.   Vitamin D , Ergocalciferol , (DRISDOL ) 1.25 MG (  50000 UNIT) CAPS capsule Take 1 capsule (50,000 Units total) by mouth every 7 (seven) days.   [DISCONTINUED] semaglutide -weight management (WEGOVY ) 1 MG/0.5ML SOAJ SQ injection Inject 1 mg into the skin once a week.   No facility-administered medications prior to visit.        Objective    BP 130/65 (BP Location: Right Arm, Patient Position: Sitting, Cuff Size: Large)   Pulse 70   Temp 98 F (36.7 C) (Oral)   Ht 4' 11 (1.499 m)   Wt (!) 425 lb (192.8 kg)   SpO2 99%   BMI 85.84 kg/m     Physical Exam Vitals and nursing note reviewed.   Constitutional:      General: She is not in acute distress.    Appearance: Normal appearance. She is morbidly obese. She is not ill-appearing or toxic-appearing.  HENT:     Head: Normocephalic and atraumatic.  Eyes:     General: No scleral icterus.    Conjunctiva/sclera: Conjunctivae normal.  Cardiovascular:     Rate and Rhythm: Normal rate.  Pulmonary:     Effort: Pulmonary effort is normal.  Neurological:     Mental Status: She is alert and oriented to person, place, and time. Mental status is at baseline.  Psychiatric:        Mood and Affect: Mood normal.        Behavior: Behavior normal.      No results found for any visits on 06/27/24.  Assessment & Plan    Sleep apnea treated with nocturnal bilevel positive airway pressure (BPAP) -     Tirzepatide-Weight Management; Inject 2.5 mg into the skin once a week.  Dispense: 2 mL; Refill: 0  Lymphedema of both lower extremities -     For home use only DME standard manual wheelchair with seat cushion  Morbid obesity with BMI of 70 and over, adult (HCC) -     For home use only DME standard manual wheelchair with seat cushion  Ambulatory dysfunction -     For home use only DME standard manual wheelchair with seat cushion  Pneumococcal vaccination administered at current visit -     Pneumococcal conjugate vaccine 20-valent     Morbid obesity with BMI of 70 and over, adult Obesity management ongoing with Wegovy . Insurance coverage ended as of 06/14/2024. Considering Zepbound for potential coverage due to comorbid severe sleep apnea. - Counseled on continued lifestyle modifications.  Sleep apnea treated with nocturnal bilevel positive airway pressure (BPAP) Upcoming evaluation with sleep study team. - Attend sleep study appointment with BPAP machine. - Prescribe Zepbound as noted.  Lymphedema of both lower extremities Managed with vibrating plate causing itching. Advised allergy pill as needed for side  effect.  Ambulatory dysfunction Requires bariatric wheelchair for mobility. Current wheelchair unsuitable. Advised contacting [employer] for bariatric model. Order to indicate need for home activities to facilitate approval. - Patient to contact [employer] to inquire about switching to a bariatric wheelchair. - Submit order indicating wheelchair is needed for her to complete activities like grooming and toileting in the home.  General Health Maintenance Discussed vaccinations. Received flu shot. Lacks COVID-19, shingles, pneumonia vaccines. Considering COVID-19 vaccine before hospital visit. Encouraged shingles vaccine and pneumonia vaccines. - Administer pneumonia vaccine today. - Encourage getting the COVID-19 vaccine before visiting [facility]. - Encourage getting the shingles vaccine at the pharmacy (per patient preference). - Discuss hepatitis B vaccination history with sister.  Recommend vaccination at a future visit, or at the  pharmacy, if not previously vaccinated.    Return in about 3 months (around 09/27/2024) for CPE, Chronic f/u.      I discussed the assessment and treatment plan with the patient  The patient was provided an opportunity to ask questions and all were answered. The patient agreed with the plan and demonstrated an understanding of the instructions.   The patient was advised to call back or seek an in-person evaluation if the symptoms worsen or if the condition fails to improve as anticipated.    LAURAINE LOISE BUOY, DO  [facility] [phone] (phone) [phone] (fax)  [facility]

## 2024-07-07 ENCOUNTER — Telehealth: Payer: Self-pay

## 2024-07-07 ENCOUNTER — Other Ambulatory Visit: Payer: Self-pay | Admitting: Family Medicine

## 2024-07-07 DIAGNOSIS — G4730 Sleep apnea, unspecified: Secondary | ICD-10-CM

## 2024-07-07 NOTE — Progress Notes (Unsigned)
 Complex Care Management Note Care Guide Note  07/07/2024 Name: Gabriela Mcgee MRN: [id number] DOB: 10-18-1970   Complex Care Management Outreach Attempts: An unsuccessful telephone outreach was attempted today to offer the patient information about available complex care management services.  Follow Up Plan:  Additional outreach attempts will be made to offer the patient complex care management information and services.   Encounter Outcome:  No Answer-Left a voicemail  Leotis Rase [facility] Guide  Direct Dial: [phone]  Fax [phone]

## 2024-07-10 ENCOUNTER — Telehealth: Payer: Self-pay | Admitting: Pharmacy Technician

## 2024-07-10 NOTE — Telephone Encounter (Signed)
 Pharmacy Patient Advocate Encounter   Received notification from Onbase that prior authorization for Wegovy  0.25MG /0.5ML auto-injectors is due for renewal.   Insurance verification completed.   The patient is insured through [employer].  Action: Medication has been discontinued. Archived Key: [id number]

## 2024-07-16 ENCOUNTER — Other Ambulatory Visit: Payer: Self-pay | Admitting: Family Medicine

## 2024-07-16 DIAGNOSIS — R32 Unspecified urinary incontinence: Secondary | ICD-10-CM

## 2024-07-18 NOTE — Telephone Encounter (Signed)
 LOV- 06/27/2024 NOV- None LRF- 05/16/2024 Outpatient Medication Detail   Disp Refills Start End   solifenacin  (VESICARE ) 5 MG tablet 30 tablet 1 05/16/2024 --   Sig - Route: Take 1 tablet (5 mg total) by mouth daily. - Oral   Sent to pharmacy as: solifenacin  (VESICARE ) 5 MG tablet   E-Prescribing Status: Receipt confirmed by pharmacy (05/16/2024  2:41 PM EDT)

## 2024-07-24 ENCOUNTER — Telehealth: Payer: Self-pay | Admitting: Family Medicine

## 2024-07-24 ENCOUNTER — Other Ambulatory Visit: Payer: Self-pay | Admitting: Family Medicine

## 2024-07-24 DIAGNOSIS — R32 Unspecified urinary incontinence: Secondary | ICD-10-CM

## 2024-07-24 NOTE — Telephone Encounter (Signed)
 [facility] faxed refill request for the following medications:  solifenacin  (VESICARE ) 5 MG tablet    Please advise.

## 2024-07-25 ENCOUNTER — Other Ambulatory Visit: Payer: Self-pay

## 2024-07-25 DIAGNOSIS — R32 Unspecified urinary incontinence: Secondary | ICD-10-CM

## 2024-07-25 NOTE — Telephone Encounter (Signed)
 LOV- 06/27/2024 NOV- None LRF- 05/16/2024 Outpatient Medication Detail   Disp Refills Start End   solifenacin  (VESICARE ) 5 MG tablet 30 tablet 1 05/16/2024 --   Sig - Route: Take 1 tablet (5 mg total) by mouth daily. - Oral   Sent to pharmacy as: solifenacin  (VESICARE ) 5 MG tablet   E-Prescribing Status: Receipt confirmed by pharmacy (05/16/2024  2:41 PM EDT)

## 2024-07-25 NOTE — Telephone Encounter (Signed)
Converted to refill request 

## 2024-07-29 MED ORDER — SOLIFENACIN SUCCINATE 5 MG PO TABS: 5.0000 mg | ORAL_TABLET | Freq: Every day | ORAL | 1 refills | Status: AC

## 2024-08-05 ENCOUNTER — Encounter: Payer: Self-pay | Admitting: Family Medicine

## 2024-08-07 ENCOUNTER — Other Ambulatory Visit: Payer: Self-pay

## 2024-08-07 DIAGNOSIS — G4730 Sleep apnea, unspecified: Secondary | ICD-10-CM

## 2024-08-07 MED ORDER — TIRZEPATIDE-WEIGHT MANAGEMENT 2.5 MG/0.5ML ~~LOC~~ SOLN: 2.5000 mg | SUBCUTANEOUS | 0 refills | Status: DC

## 2024-08-21 ENCOUNTER — Ambulatory Visit: Admitting: Urology

## 2024-08-28 NOTE — Progress Notes (Unsigned)
 Sleep Medicine   Office Visit  Patient Name: Gabriela Mcgee DOB: 1971-03-19 MRN [id number]    Chief Complaint: OSA  Brief History:  Gabriela Mcgee presents for an initial consult for sleep evaluation to establish care on BIPAP @ 23/18 cmH2O.   She has a 14 year  history of sleep apnea. Prior to PAP therapy the patient reports the following symptoms: snoring, headaches, and difficulty maintaining sleep She reports her sleep quality is fair. This is noted most nights. Sleep quality is the same when outside home environment. Patient is using PAP nightly.  The patient sometimes feels rested after sleeping with PAP, due to poor sleep schedule.  The patient reports benefit from PAP use. Reported sleepiness is improved and the Epworth Sleepiness Score is 17 out of 24. The patient does take 1 -2 hour naps with PAP therapy. The patient complains of the following: needs supplies.  The compliance download shows 99% compliance with an average use time of 9 hours 57 minutes. The AHI is 8.5. Patient has noted no restlessness of her legs at night, but does move them due to lymphedema.  The patient  relates no unusual behavior during the night.  The patient reports anxiety and depression as a history of psychiatric problems.  The patient relates  Cardiovascular risk factors include: HTN.  Patient's current machine is 55 year old and malfunctioning, it is out of warranty and obsolete for repair, to assure reliable continued treatment for CSA a replacement machine is needed.   ROS  General: (-) fever, (-) chills, (-) night sweat Nose and Sinuses: (-) nasal stuffiness or itchiness, (-) postnasal drip, (-) nosebleeds, (-) sinus trouble. Mouth and Throat: (-) sore throat, (-) hoarseness. Neck: (-) swollen glands, (-) enlarged thyroid , (-) neck pain. Respiratory: + cough, - shortness of breath, - wheezing. Neurologic: - numbness, - tingling. Psychiatric: + anxiety, + depression Sleep behavior: -sleep paralysis  -hypnogogic hallucinations -dream enactment      -vivid dreams -cataplexy -night terrors -sleep walking   Current Medication: Outpatient Encounter Medications as of 08/29/2024  Medication Sig   albuterol  (VENTOLIN  HFA) 108 (90 Base) MCG/ACT inhaler Inhale 1-2 puffs into the lungs every 6 (six) hours as needed for shortness of breath.   amitriptyline  (ELAVIL ) 25 MG tablet Take 1 tablet (25 mg total) by mouth at bedtime as needed for sleep.   citalopram  (CELEXA ) 40 MG tablet Take 1 tablet (40 mg total) by mouth daily.   ibuprofen (ADVIL) 200 MG tablet Take 600 mg by mouth every 6 (six) hours as needed.   Iron , Ferrous Sulfate , 325 (65 Fe) MG TABS Take 325 mg by mouth daily.   loratadine  (CLARITIN ) 10 MG tablet Take 1 tablet (10 mg total) by mouth daily.   losartan  (COZAAR ) 25 MG tablet Take 1 tablet (25 mg total) by mouth daily.   mirabegron  ER (MYRBETRIQ ) 25 MG TB24 tablet Take 1 tablet (25 mg total) by mouth daily.   solifenacin  (VESICARE ) 5 MG tablet Take 1 tablet (5 mg total) by mouth daily.   Vitamin D , Ergocalciferol , (DRISDOL ) 1.25 MG (50000 UNIT) CAPS capsule Take 1 capsule (50,000 Units total) by mouth every 7 (seven) days.   [DISCONTINUED] tirzepatide  (ZEPBOUND ) 2.5 MG/0.5ML injection vial Inject 2.5 mg into the skin once a week.   No facility-administered encounter medications on file as of 08/29/2024.    Surgical History: Past Surgical History:  Procedure Laterality Date   ABDOMINAL HYSTERECTOMY      Medical History: Past Medical History:  Diagnosis Date  Allergy    Anemia    Blood transfusion without reported diagnosis    Hypertension    Neuropathy    Sleep apnea     Family History: Non contributory to the present illness  Social History: Social History   Socioeconomic History   Marital status: Single    Spouse name: Not on file   Number of children: Not on file   Years of education: Not on file   Highest education level: Not on file  Occupational  History   Not on file  Tobacco Use   Smoking status: Never   Smokeless tobacco: Never  Vaping Use   Vaping status: Never Used  Substance and Sexual Activity   Alcohol use: Never   Drug use: Never   Sexual activity: Not on file  Other Topics Concern   Not on file  Social History Narrative   Not on file   Social Drivers of Health   Tobacco Use: Low Risk (08/29/2024)   Patient History    Smoking Tobacco Use: Never    Smokeless Tobacco Use: Never    Passive Exposure: Not on file  Financial Resource Strain: Not on file  Food Insecurity: No Food Insecurity (06/16/2023)   Hunger Vital Sign    Worried About Running Out of Food in the Last Year: Never true    Ran Out of Food in the Last Year: Never true  Transportation Needs: No Transportation Needs (06/16/2023)   PRAPARE - Administrator, Civil Service (Medical): No    Lack of Transportation (Non-Medical): No  Physical Activity: Not on file  Stress: Not on file  Social Connections: Not on file  Intimate Partner Violence: Not At Risk (06/16/2023)   Humiliation, Afraid, Rape, and Kick questionnaire    Fear of Current or Ex-Partner: No    Emotionally Abused: No    Physically Abused: No    Sexually Abused: No  Depression (PHQ2-9): Low Risk (06/27/2024)   Depression (PHQ2-9)    PHQ-2 Score: 2  Recent Concern: Depression (PHQ2-9) - Medium Risk (05/16/2024)   Depression (PHQ2-9)    PHQ-2 Score: 5  Alcohol Screen: Not on file  Housing: Patient Unable To Answer (06/16/2023)   Housing    Last Housing Risk Score: 0  Utilities: Not At Risk (06/16/2023)   AHC Utilities    Threatened with loss of utilities: No  Health Literacy: Not on file    Vital Signs: Blood pressure (!) 153/99, pulse 75, resp. rate 20, height 4' 11 (1.499 m), weight (!) 403 lb 6.4 oz (183 kg), SpO2 98%. Body mass index is 81.48 kg/m.   Examination: General Appearance: The patient is well-developed, well-nourished, and in no distress. Neck  Circumference: 41.5 cm Skin: Gross inspection of skin unremarkable. Head: normocephalic, no gross deformities. Eyes: no gross deformities noted. ENT: ears appear grossly normal Neurologic: Alert and oriented. No involuntary movements.    STOP BANG RISK ASSESSMENT S (snore) Have you been told that you snore?     NO   T (tired) Are you often tired, fatigued, or sleepy during the day?   YES  O (obstruction) Do you stop breathing, choke, or gasp during sleep? NO   P (pressure) Do you have or are you being treated for high blood pressure? YES   B (BMI) Is your body index greater than 35 kg/m? YES   A (age) Are you 42 years old or older? YES   N (neck) Do you have a neck circumference greater than  16 inches?   YES   G (gender) Are you a female? NO   TOTAL STOP/BANG YES ANSWERS 5                                                               A STOP-Bang score of 2 or less is considered low risk, and a score of 5 or more is high risk for having either moderate or severe OSA. For people who score 3 or 4, doctors may need to perform further assessment to determine how likely they are to have OSA.         EPWORTH SLEEPINESS SCALE:  Scale:  (0)= no chance of dozing; (1)= slight chance of dozing; (2)= moderate chance of dozing; (3)= high chance of dozing  Chance  Situtation    Sitting and reading: 2    Watching TV: 2    Sitting Inactive in public: 3    As a passenger in car: 3      Lying down to rest: 3    Sitting and talking: 1    Sitting quielty after lunch: 3    In a car, stopped in traffic: 0   TOTAL SCORE:   17 out of 24    SLEEP STUDIES:  Split Study (06/2010) AHI 92.7, Min Sp02 72%, final level 22/18 cmH2O, optimal 17 cmH2O.       CPAP COMPLIANCE DATA:  Date Range: 06/01/2024 - 08/29/2024  Average Daily Use: 10 hours 4 minutes  Median Use: 10 hours 3 minutes  Compliance for > 4 Hours: 99% days  AHI: 8.5 respiratory events per hour  Days  Used: 89/90  Mask Leak: 31.7  95th Percentile Pressure: 23/18 cmH2O   LABS: No results found for this or any previous visit (from the past 2160 hours).  Radiology: CT FEMUR RIGHT W CONTRAST Result Date: 06/16/2023 CLINICAL DATA:  Soft tissue infection suspected, thigh, no prior imaging. EXAM: CT OF THE LOWER RIGHT EXTREMITY WITH CONTRAST TECHNIQUE: Multidetector CT imaging of the lower right extremity was performed according to the standard protocol following intravenous contrast administration. RADIATION DOSE REDUCTION: This exam was performed according to the departmental dose-optimization program which includes automated exposure control, adjustment of the mA and/or kV according to patient size and/or use of iterative reconstruction technique. CONTRAST:  OMNIPAQUE  IOHEXOL  350 MG/ML SOLN COMPARISON:  None Available. FINDINGS: Bones/Joint/Cartilage No acute fracture or dislocation. No aggressive osseous lesion. Bilateral hip joints are within normal limits. Ligaments Suboptimally assessed by CT. Muscles and Tendons No focal lesion. Soft tissues There is extensive soft tissue swelling/edema throughout bilateral imaged thighs, with asymmetric involvement of the medial aspect of the right thigh. Multiple subcutaneous vessels are seen traversing through this edematous tissue. However, no discrete walled-off abscess or collection seen. There is lobulated appearance of uterus and bilateral adnexa, not well evaluated on this exam. Further evaluation with dedicated nonemergent CT scan of the pelvis is recommended. IMPRESSION: 1. Extensive asymmetric soft tissue swelling/edema along the medial aspect of the right thigh, favoring cellulitis/soft tissue infection. No discrete walled-off abscess seen. 2. There is lobulated appearance of uterus and bilateral adnexa, not well evaluated on this exam. Further evaluation with dedicated nonemergent CT scan of the pelvis is recommended. Electronically Signed   By:  Ree Kimberlee HERO.D.  On: 06/16/2023 18:32    No results found.  No results found.    Assessment and Plan: Patient Active Problem List   Diagnosis Date Noted   At high risk for aspiration 07/05/2023   Open wound of right lower extremity 06/17/2023   Cellulitis of right thigh 06/16/2023   Essential hypertension 06/16/2023   Peripheral neuropathy 06/16/2023   Depression 06/16/2023   Abnormal finding present on diagnostic imaging of uterus 06/16/2023   Venous stasis ulcer of left lower extremity (HCC) 03/25/2023   Urinary incontinence 03/25/2023   Peripheral neuropathic pain 03/25/2023   Primary hypertension 03/25/2023   Depression, recurrent 03/25/2023   Lymphedema of both lower extremities 03/25/2023   Difficulty maintaining body in lying position 03/25/2023   Chronic fatigue 03/25/2023   Annual physical exam 03/25/2023   Morbid obesity with BMI of 70 and over, adult (HCC) 03/17/2023   Sleep apnea treated with nocturnal bilevel positive airway pressure (BPAP) 03/17/2023   Ambulatory dysfunction 03/17/2023   GERD (gastroesophageal reflux disease) 04/10/2014   History of DVT (deep vein thrombosis) 04/10/2014   Lymphedema 06/07/2013     PLAN OSA:   Patient is using and benefiting from PAP use. Compliance is excellent, but AHI elevated 8.5. Will need updated testing including bipap titration since not controlled. Will order split study. Patient will need hospital bed for study due to lymphedema and inability to lay in regular bed or get and out of regular bed. The patient's machine is past end of life and must be replaced. Patient continues to require PAP to treat their apnea and is medically necessary.   1. Sleep apnea treated with nocturnal bilevel positive airway pressure (BPAP) (Primary) Will order split study to location with hospital bed availability. Continue excellent compliance. Follow up 30+ days after set up once testing updated - Split night study; Future  2.  Encounter for BiPAP use counseling PAP couseling-Discussed importance of adequate PAP use as well as proper care and cleaning techniques of machine and all supplies.  3. Primary hypertension Continue current medication and f/u with PCP.  4. Lymphedema of both lower extremities Followed by PCP  5. Morbid obesity with BMI of 70 and over, adult [facility]) Obesity Counseling: Had a lengthy discussion regarding patients BMI and weight issues. Patient was instructed on portion control as well as increased activity. Also discussed caloric restrictions with trying to maintain intake less than 2000 Kcal. Discussions were made in accordance with the 5As of weight management. Simple actions such as not eating late and if able to, taking a walk is suggested.    General Counseling: I have discussed the findings of the evaluation and examination with Shayma.  I have also discussed any further diagnostic evaluation thatmay be needed or ordered today. Ryka verbalizes understanding of the findings of todays visit. We also reviewed her medications today and discussed drug interactions and side effects including but not limited excessive drowsiness and altered mental states. We also discussed that there is always a risk not just to her but also people around her. she has been encouraged to call the office with any questions or concerns that should arise related to todays visit.  Orders Placed This Encounter  Procedures   Split night study    Standing Status:   Future    Expiration Date:   08/29/2025    Scheduling Instructions:     Needs split study and will need hospital bed for testing due to lymphedema. Patient currently on BIPAP 23/18 cm  H2O  with uncontrolled AHI of 8.5. Will need split for insurance with quick titration onto bipap to determine new settings for replacement machine.    Where should this test be performed::   Other        I have personally obtained a history, evaluated the patient,  evaluated pertinent data, formulated the assessment and plan and placed orders.  This patient was seen by Tinnie Pro, PA-C in collaboration with Dr. Elfreda Bathe as a part of collaborative care agreement.    Elfreda DELENA Bathe, MD [facility] Diplomate ABMS Pulmonary and Critical Care Medicine Sleep medicine

## 2024-08-29 ENCOUNTER — Ambulatory Visit (INDEPENDENT_AMBULATORY_CARE_PROVIDER_SITE_OTHER): Payer: Self-pay | Admitting: Internal Medicine

## 2024-08-29 VITALS — BP 153/99 | HR 75 | Resp 20 | Ht 59.0 in | Wt >= 6400 oz

## 2024-08-29 DIAGNOSIS — I890 Lymphedema, not elsewhere classified: Secondary | ICD-10-CM | POA: Diagnosis not present

## 2024-08-29 DIAGNOSIS — I10 Essential (primary) hypertension: Secondary | ICD-10-CM

## 2024-08-29 DIAGNOSIS — Z6841 Body Mass Index (BMI) 40.0 and over, adult: Secondary | ICD-10-CM | POA: Diagnosis not present

## 2024-08-29 DIAGNOSIS — G4733 Obstructive sleep apnea (adult) (pediatric): Secondary | ICD-10-CM | POA: Diagnosis not present

## 2024-08-29 DIAGNOSIS — G4730 Sleep apnea, unspecified: Secondary | ICD-10-CM | POA: Diagnosis not present

## 2024-08-29 DIAGNOSIS — Z7189 Other specified counseling: Secondary | ICD-10-CM | POA: Diagnosis not present

## 2024-08-29 NOTE — Patient Instructions (Signed)

## 2024-09-25 ENCOUNTER — Ambulatory Visit: Admitting: Urology

## 2024-10-02 ENCOUNTER — Ambulatory Visit: Admitting: Urology

## 2024-10-03 ENCOUNTER — Telehealth: Payer: Self-pay

## 2024-10-03 NOTE — Progress Notes (Unsigned)
 Complex Care Management Note Care Guide Note  10/03/2024 Name: Gabriela Mcgee MRN: [id number] DOB: [birth year]   Complex Care Management Outreach Attempts: A second unsuccessful outreach was attempted today to offer the patient with information about available complex care management services.  Follow Up Plan:  Additional outreach attempts will be made to offer the patient complex care management information and services.   Encounter Outcome:  No AnswerLeft voicemail  rightBrandy [facility]  [facility], [facility] Guide  Direct Dial: [phone]  Fax [phone]

## 2024-10-05 ENCOUNTER — Telehealth: Payer: Self-pay

## 2024-10-05 DIAGNOSIS — G4730 Sleep apnea, unspecified: Secondary | ICD-10-CM

## 2024-10-05 NOTE — Progress Notes (Unsigned)
 Complex Care Management Note Care Guide Note  10/05/2024 Name: Gabriela Mcgee MRN: [id number] DOB: 12-24-1970   Complex Care Management Outreach Attempts: A third unsuccessful outreach was attempted today to offer the patient with information about available complex care management services.  Follow Up Plan:  No further outreach attempts will be made at this time. We have been unable to contact the patient to offer or enroll patient in complex care management services.  Encounter Outcome:  No Answer-Left voicemail  Leotis Rase [facility], [facility] Guide  Direct Dial: [phone]  Fax [phone]

## 2024-10-05 NOTE — Progress Notes (Signed)
 Complex Care Management Note Care Guide Note  10/05/2024 Name: Gabriela Mcgee MRN: [id number] DOB: Feb 10, 1971   Complex Care Management Outreach Attempts: A third unsuccessful outreach was attempted today to offer the patient with information about available complex care management services.  Follow Up Plan:  No further outreach attempts will be made at this time. We have been unable to contact the patient to offer or enroll patient in complex care management services.  Encounter Outcome:  No Answer-Left Voicemail  Leotis Rase [facility], [facility] Guide  Direct Dial: [phone]  Fax [phone]

## 2024-11-01 ENCOUNTER — Ambulatory Visit: Admitting: Family Medicine

## 2024-12-04 ENCOUNTER — Ambulatory Visit: Admitting: Urology
